# Patient Record
Sex: Female | Born: 1960 | Race: White | Hispanic: No | Marital: Married
Health system: Midwestern US, Academic
[De-identification: ages and names within clinical notes are randomized; demographics above are authoritative.]

---

## 2018-01-25 ENCOUNTER — Encounter: Admit: 2018-01-25 | Discharge: 2018-01-25 | Payer: Commercial Managed Care - PPO

## 2018-01-25 DIAGNOSIS — Z1231 Encounter for screening mammogram for malignant neoplasm of breast: Principal | ICD-10-CM

## 2021-07-22 ENCOUNTER — Encounter: Admit: 2021-07-22 | Discharge: 2021-07-22 | Payer: BC Managed Care – PPO

## 2021-07-22 ENCOUNTER — Ambulatory Visit: Admit: 2021-07-22 | Discharge: 2021-07-22 | Payer: BC Managed Care – PPO

## 2021-07-22 DIAGNOSIS — Z1231 Encounter for screening mammogram for malignant neoplasm of breast: Secondary | ICD-10-CM

## 2021-07-27 ENCOUNTER — Encounter: Admit: 2021-07-27 | Discharge: 2021-07-27 | Payer: BC Managed Care – PPO

## 2021-07-27 DIAGNOSIS — R928 Other abnormal and inconclusive findings on diagnostic imaging of breast: Secondary | ICD-10-CM

## 2021-08-08 ENCOUNTER — Encounter: Admit: 2021-08-08 | Discharge: 2021-08-08 | Payer: BC Managed Care – PPO

## 2021-08-08 ENCOUNTER — Ambulatory Visit: Admit: 2021-08-08 | Discharge: 2021-08-08 | Payer: BC Managed Care – PPO

## 2021-08-08 DIAGNOSIS — R928 Other abnormal and inconclusive findings on diagnostic imaging of breast: Secondary | ICD-10-CM

## 2021-08-08 NOTE — Progress Notes
I spoke with Kristy Martinez this afternoon in regards to the Radiologist recommending that she have a Stereotactic Breast Biopsy.   She was educated on what to expect during and after the procedure.   She does not take a daily aspirin or any anti-coagulants. She is scheduled for her biopsy procedure for 08/11/2021.  I reviewed with her that she needed to be at Breast Imaging 1 hour prior to the appointment time.  She verbalized understanding in regards to all instructions and did not have any questions.

## 2021-08-10 ENCOUNTER — Encounter: Admit: 2021-08-10 | Discharge: 2021-08-10 | Payer: BC Managed Care – PPO

## 2021-08-10 DIAGNOSIS — R928 Other abnormal and inconclusive findings on diagnostic imaging of breast: Secondary | ICD-10-CM

## 2021-08-10 DIAGNOSIS — R920 Mammographic microcalcification found on diagnostic imaging of breast: Secondary | ICD-10-CM

## 2021-08-11 ENCOUNTER — Ambulatory Visit: Admit: 2021-08-11 | Discharge: 2021-08-11 | Payer: BC Managed Care – PPO

## 2021-08-11 ENCOUNTER — Encounter: Admit: 2021-08-11 | Discharge: 2021-08-11 | Payer: BC Managed Care – PPO

## 2021-08-11 DIAGNOSIS — R928 Other abnormal and inconclusive findings on diagnostic imaging of breast: Secondary | ICD-10-CM

## 2021-08-11 DIAGNOSIS — R920 Mammographic microcalcification found on diagnostic imaging of breast: Secondary | ICD-10-CM

## 2021-08-11 MED ORDER — LIDOCAINE-EPINEPHRINE 1 %-1:100,000 IJ SOLN
30 mL | Freq: Once | INTRAMUSCULAR | 0 refills | Status: CP
Start: 2021-08-11 — End: ?
  Administered 2021-08-11: 14:00:00 10 mL via INTRAMUSCULAR

## 2021-08-11 MED ORDER — LIDOCAINE 1% (BUFFERED) SYRINGE (BREAST CENTER)
1 mL | Freq: Once | INTRAMUSCULAR | 0 refills | Status: CP
Start: 2021-08-11 — End: ?

## 2021-08-11 MED ORDER — LIDOCAINE 1%-EPINEPHRINE 1:100000 (BUFFERED) VIAL (BREAST CENTER)
7 mL | Freq: Once | INTRAMUSCULAR | 0 refills | Status: CP
Start: 2021-08-11 — End: ?

## 2021-08-11 NOTE — Progress Notes
Kristy Martinez is here for a right stereotactic breast biopsy today. She has been educated, risk of procedure were discussed and her questions answered prior to signing procedure consent with Dr. Laurance Flatten and this nurse present.

## 2021-08-16 ENCOUNTER — Encounter: Admit: 2021-08-16 | Discharge: 2021-08-16 | Payer: BC Managed Care – PPO

## 2021-08-16 NOTE — Telephone Encounter
LM for Kristy Martinez that scheduling had reached out to me and said they had scheduled an appointment for concerns on her recent mammogram. Looking into it, she has been diagnosed with a breast cancer and I am not an appropriate person for her to see with the new diagnosis. She needs to see one of the breast surgeons. I have reached out to navigation to contact her to schedule with a surgeon. I am cancelling the appointment for tomorrow. Callback provided.    Mina Marble, PA-C

## 2021-08-16 NOTE — Telephone Encounter
[facility] Intake Assessment    Patient Name:  Kristy Martinez  DOB: 07/07/1961  Insurance:  [employer]   Direct Referral: None  Appointment Info:    Future Appointments   Date Time Provider Department Center   08/19/2021  8:00 AM Ignatius Specking, MD [id number] [place] Exam   08/19/2021  9:00 AM Everitt Amber, MD [id number] [place] Exam   08/19/2021 10:00 AM [facility] RAD ONC PROVIDER Philip Aspen [place] Exam     Diagnosis & Reason for Visit:  Ductal carcinoma in situ      Physician Info:  ? Referring Physician:  Dr Steva Ready     Location of Films:   IN HOUSE    Location of Pathology:  IN HOUSE    History of Present Illness:  Kristy Martinez, age 60,  had right breast microcalcifications  discovered on her asymptomatic routine screening mammogram on 07/22/21 at [facility] .  She was diagnosed on follow up 08/11/21 biopsy with right breast ductal carcinoma in situ . Has history of 06/07/16 left  breast biopsy at [facility], demonstrated left breast fibroadenoma.      Final Diagnosis:   A. 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,   cribriform and solid type, with calcifications and comedonecrosis    Testing performed on block number: A1   Estrogen Receptor (ER):          70%     Positive     Stain Intensity:   Strong   Progesterone Receptor (PR):     10%     Positive     Stain Intensity:   Moderate     Allergies reviewed and verified with the patient, and documented in Epic:  Yes    Family History of Breast Cancer:   Breast Cancer: Maternal Grandma dx age 72s   Ovarian Cancer: None   Prostate Cancer: Maternal Grandpa dx age 71     Menopausal Status:  Post     Personal History of Other Cancers: None      NEEDS Assessment:    Genetic Counseling:  Genetic Assessment: No identified risk factors             Nutrition:  Additional Nutrition Assessment: No concerns identified  Nutrition Intervention: Provided information about available services    Social & Financial:  Social and Financial Assessment: Reports adequate support system     Social and Financial Intervention: Provided information about available services    Spiritual & Emotional:  Spiritual and Emotional Assessment: Reports adequate support system  Spiritual and Emotional Intervention: Provided information about available services    Physical:  Fall Risk: None identified       Communication:  Communication Barrier: No       Onc Fertility:   Onc Fertility Assessment: Female patient is postmenopausal or has had hysterectomy     COVID-19 guidelines reviewed with patient, including: visitor and universal masking policies, and a temperature check at the facility entrance upon arrival.

## 2021-08-17 ENCOUNTER — Encounter: Admit: 2021-08-17 | Discharge: 2021-08-17 | Payer: BC Managed Care – PPO

## 2021-08-18 NOTE — Patient Instructions
The anesthesia team will provide your arrival time for surgery approx. 1 week before date of surgery.   If you have questions or forget your arrival time, Call:  [phone]  **(Your surgery will not be listed under Visits  in your MyChart)**  BREAST SURGERY POST-OPERATIVE INSTRUCTIONS    DIET  -You have no dietary restrictions. Please continue with a healthy balanced diet.  -Do not drink alcohol while taking narcotic pain medication.    ACTIVITY  -No driving while taking narcotic pain medication.  -No lifting greater than 10 pounds.   -No repetitive motions with the arm on the side of surgery (ie. laundry, vacuuming, washing windows/floors, etc.)  -Begin the exercises found in your treatment guide 24 hours after surgery.  -You may resume your normal activity once cleared by your surgeon. This can be discussed at your post-operative appointment.    INCISION CARE  -Keep your incision clean and dry.    -You may shower 24 hours following your procedure. It is safe for the incisions to get wet unless otherwise instructed by your surgeon. Pat incisions dry after showering.  -Avoid applying deodorants, powders, creams, lotions, etc. to your incision for 4 weeks.  -Usually, there are no stitches to be removed. You will either have a Dermabond (skin glue) or Therabond (silver covering) dressing over your incision.    *Dermabond is an antimicrobial barrier that will begin to flake after approximately 1 week. Do not peel to remove prior to flaking. Do NOT apply ointments to your incisions over the Dermabond, as these substances can weaken the barrier and allow for skin edge separation.    *Therabond is a silver impregnated dressing that promotes wound healing. This dressing is to stay in place until your surgeon instructs you to remove it.  -Your incision should gradually look better each day.  If you notice unusual swelling, redness, drainage, have increasing pain at the site, or have a fever greater than 100 degrees, notify your physician immediately.  -You may have a pink post-surgery bra and fluffs in place.  This can be removed 24 hours after surgery to shower. Continue to wear the pink bra or a snug sports bra until your post-operative appointment. Do NOT wear underwire bras.    SIGNS & SYMPTOMS  -Please contact your doctor if you have any of the following symptoms: temperature higher than 100 degrees F, uncontrolled pain, persistent nausea and/or vomiting, difficulty breathing or breast redness, leakage or incision breakdown/opening.     IF YOU HAVE A DRAIN  *WASH HANDS PRIOR TO ANY HANDLING OF DRAIN.   -Please write down daily (total for 24 hours) drain output and empty multiple times a day if needed or according to physician's instructions. Record the output in milliliters.  -There is a CHG drain dressing in place over the entry site of your drain. This is to remain in place until removed by your surgeon. There is an antimicrobial gel in the center rectangle of the dressing. This is normal and not a sign of fluid leaking. If there is physical liquid leaking on the skin outside the area of the drain, please notify your surgeons office.   -To empty the drain, open the lid on top of the bulb.  Pour the fluid into a measuring cup and record.  Close by squeezing the bulb until as much air as possible is removed, then cap the lid to recreate suction.   -Strip the drain twice daily or as needed by pinching the tubing near  the skin with both hands.  Keep hand closest to you steady and with the other, slowly squeeze the liquid toward the bulb.  This will prevent any clots or debris from clogging the drain.   -It is okay to shower with the drain and dressing in place.  Pat the dressing dry after showering.    *When showering consider placing your back to the water and letting the water run over the surgical areas. Soap and water contact over the surgical sites is OK. Please do not soak or scrub the sites.     *A lanyard can be used to hook the drains around your neck to allow freedom of hands for showering.    -Please bring the drain record to your next clinic appointment.   -Secure drains to your post-operative bra or use a safety pin to pin to clothing. Do not let drains dangle without support.   -DO NOT TRY TO PUSH THE DRAIN BACK IN IF IT GETS PULLED OUT (even a little bit), AS THIS IS A BIG INFECTION RISK.  If you have any problems with the drain (ex. large amounts of fluid, very bloody fluid, or loss of suction), please call your doctor.    MEDICATIONS  -Do not exceed 4000mg  of Tylenol (acetaminophen) in a 24-hour period. The pain medication prescribed may have a narcotic and Tylenol together. Please verify this on your medication bottle prior to taking additional Tylenol.    OPIOID (NARCOTIC) PAIN MEDICATION SAFETY  -We care about your comfort and believe you may need opioid medications at this time to treat your pain.  An opioid is a strong pain medication.  It is only available by prescription for moderate to severe pain.  Usually, these medications are used for only a short time to treat pain.    -When used the right way, opioids are safe and effective medications to treat your pain.  Yet, when used in the wrong way, opioids can be dangerous for you or others.  Opioids do not work for everyone.  Most patients do not get full relief of their pain from opioid medication; full relief of your pain may not be possible.    -For your safety, we ask you to follow these instructions:  *Only take your opioid medication as prescribed.  If your pain is not controlled with the prescribed dose or the medication is not lasting long enough, call your doctor.  *Do not break or crush your opioid medication unless your doctor or pharmacist says you can.  With certain medications this can be dangerous and may cause death.  *Never share your medication with others even if they appear to have a good reason.  Never take someone else?s pain medication - this is dangerous and illegal.  Overdoses and deaths have occurred.  *Keep your opioid medications safe, as you would with cash, in a lock box or similar container.  *Make sure your opioids are going to be secure, especially if you are around children or teens.   *Talk with your doctor or pharmacist if you have questions about taking other medications.   *Avoid driving, operative machinery or drinking alcohol while taking opioid pain medication.  This may be unsafe.     CONSTIPATION PREVENTION  -Most patients require narcotic pain medication following surgery and these medications frequently cause significant constipation. Constipation can also occur post-operatively due to anesthesia, inactivity and decrease in oral intake. Constipation can cause discomfort, bloating, nausea and vomiting. Below are recommendations on how to  treat and prevent constipation if you experience this following surgery.    -Start a daily laxative regimen as soon as you start any narcotic pain medication or see any deviation from your normal bowel pattern. You will be given a prescription for Senna-S after surgery. If you stay overnight after your surgery, a laxative will be started while you are in the hospital.      -Medications that can help with constipation:    *Senna-S    *Miralax, 1-2 capfuls daily    *Fiber supplements (Fibercon or Benefiber). Follow manufacturer dosing recommendations.                                          You can also consider dietary fiber options such as prunes or prune juice.    *Probiotics (Bio-K+, Culturelle, Align or Florastor), 1-2 daily. Do not take a probiotic if you are on chemotherapy unless you have received permission from your oncologist.     *Milk of Magnesia 1,2000mg /9mL, 2-4 doses daily. Do not take if you have kidney issues or are on dialysis.     -If you are still having trouble with constipation despite using the above medications for 1-2 days without a bowel movement or have significant discomfort from constipation you may try the below over the counter option. Do not go longer than 2 days without a bowel movement before you try taking additional laxatives.    *Magnesium Citrate (1-2 bottles in 24 hour period). Do not take if you have kidney issues or are on dialysis.       For questions or concerns regarding your hospital stay:  - DURING BUSINESS HOURS (8:00 AM - 4:30 PM):    Call [phone] to speak to your surgeon?s nurse    - AFTER BUSINESS HOURS (4:30 PM - 8:00 AM, on weekends, or holidays):  Call [phone] and ask to page the BREAST SURGERY team      IF A PLASTIC SURGEON WAS INVOLVED IN YOUR SURGERY:    - DURING BUSINESS HOURS (8:00 AM - 4:30 PM):    Call the office at [phone]    - AFTER BUSINESS HOURS (4:30 PM - 8:00 AM, on weekends, or holidays):  Call [phone] and ask to page the PLASTIC SURGERY team    *If you had reconstruction with a plastic surgeon, all pain medication refills will be completed by their office.       Post-Operative Exercises after Breast Surgery      Once your breast cancer surgery is complete, you should immediately begin performing simple exercises.  These exercises are safe to do even if you have drains in place and are designed to promote circulations, keep your muscles strong, reduce stiffness in your shoulder and reduce scar tissue.  Your chest and underarm may feel tight after your surgery, but this is normal and should decrease once you begin the exercises.  Sore or numb feelings in the chest wall or back of the arm are also normal, but call if you experience any swelling or notice any redness or tenderness.    Note:  If you underwent reconstructive surgery, talk to your surgery team about when to begin these exercises.        Exercise 1.    In this exercise, you will bend your arm at the elbow.  Then, you will extend your arm back straight.    Lorenz Coaster  down and extend your arm (on the surgery side) straight to your side.  Keep your palm facing forward.  Bend your arm at the elbow.  Extend your arm back straight, fully stretching it out.  Repeat five times, three sets per day.       Exercise 2.  During this exercise, you will rotate your shoulders slowly in a backward motion, then in a forward motion.    Lie down and relax your shoulders.  Rotate your shoulders slowly in a backward motion.  Reverse the motion and slowly rotate them forward.  Repeat five times, three sets per day.          Exercise 3.  This exercise has you squeeze your shoulder blades together.    Lie down face up and keep your arms at your sides.  Squeeze your shoulder blades together.  Hold this position for five seconds.  Repeat five times, three sets per day.         Exercise 4.  In this exercise, you will extend your forearms out, forming right angles at your elbows.    Lie down face up on the bed, and keep your arms to your sides.  Extend your forearms out, forming right angles at your elbows.  Turn your forearms out so the backs of your hands face the bed.  Bring your forearms back in and bring your palms towards your abdomen.  Repeat five times, three sets per day.         Exercise 5.  This exercise has you raising your forearms to form a right angle at your elbow, then pushing your elbow down against the bed.    Lie down face up on the bed and hold your arm next to your side.  Raise your forearm to form a right angle at your elbow.   Push your elbow down against the bed.  Repeat five times, three sets per day.        Exercise 6.  In this exercise, you will push your arm against the wall and then pull it tight back to your side.    Stand with your side against a wall.  Keep your upper arm next to your side and bring your forearm up to form a right angle at your elbow.  Push your arm against the wall and then pull it tight back to your side.  Repeat five times, three sets per day.                       POST-OPERATIVE EXERCISES WITHOUT DRAINS    If you don?t have drains - or if your drains have been removed -  you can now begin this set of exercises. These are designed to improve your range of motion and strengthen your arm and shoulder.  Be sure to complete the exercises carefully to achieve a slow stretch, and avoid bouncing or jerking your arm.  Full range of motion should return within one to two months following your surgery, so if you continue to have trouble completing your normal activities (such as dressing, grooming, etc.) talk to your doctor, who may refer you to a physical therapist.      Exercise 1.  In this exercise, you will rotate your shoulders slowly in a backward motion,  then in a forward motion.    Lie down face up and relax your shoulders.  Rotate your shoulders slowly in a backward motion.  Reverse the motion and  slowly rotate them forward.  Repeat five times, three sets per day.      Exercise 2. This exercise has you squeeze your shoulder blades together.    Lie down face up and keep your arms at your sides.  Squeeze your shoulder blades together.  Hold this position for five seconds.  Repeat five times, three sets per day.       Exercise 3.  During this exercise, you will slowly pull your elbow and arm across your chest.    Lie down face up and grab the elbow that?s on the side of your surgery with your other hand.  Slowly pull your elbow and arm across your chest.  You should feel a stretch.  Hold this position for five seconds.  Repeat five times, three sets per day      Exercise 4.  Here, you will raise your arm (on your surgery side) over your head as far as you can reach.    Lie down face up and raise your arm (on your surgery side) over your head as far as you can reach.  Keep your thumb down and your elbow straight.  Hold this position for five seconds and then slowly return your arm to the original position.  Repeat five times, three sets per day      Exercise 5.  This exercise stretches across your chest as you slowly lower your elbows down to the sides.    Lie down face up and place your hands behind your neck.  Point your elbows up.  Slowly lower your elbows down to touch the bed.  Repeat five times, three sets per day       Exercise 6.  You will use a small, rolled towel during this exercise.    Lie down on your back on a small, rolled towel (about 2-3 inches thick) placed between your shoulder blades.  Remain in this position on the towel for 5 minutes.  Repeat three times each day       Exercise 7.  You should feel a stretch in your shoulder as you gently pull your arm (on the surgery side) behind you.    Stand and reach behind you with the arm oth the surgery side.  Grasp that arm with your other hand.  Gently pull your arm up.  You should feel a stretch in your shoulder.  Hold this position for five seconds.  Repeat five times, three sets per day       Exercise 8.  This stretch requires the use of a wall to stretch your shoulders.    Stand in a corner 1 to 2 feet away from the wall.  Place your hands on the wall.  Lean in to gently stretch your chest and your shoulders on the surgery side.  Vary the exercise by placing your hands higher or lower on the wall, or by stepping back from the corner slightly.  Hold the stretch for 30 seconds.  Repeat five times, three sets per day        EXAMPLES OF POSTOPERATIVE BRAS    -You will go home from surgery with a pink bra in place.     -This is only a guideline with examples of other appropriate postoperative bras.  You do not need to purchase these unless you would like additional bras to wear.  [facility] does not profit from any of these bras  - Wear non-under wire bras for 4 weeks.   -  Sports bras that snap or zip in front are usually the easiest after breast surgery.     [employer]:  women?s power shape max support front closure   Price: $24.99 + tax       -    [employer]: Fruit [employer]oom Comfort front close       Price: $7-$8 + tax       [employer]:  [employer] Bra [id number]  Price [phone] + tax          Under Armour- ARMOUR eclipse high impact zip bra   Higher quality.  Will last forever if you hang up to dry.  Has best support for exercise.  Online or in stores: Under Armour zip front sports bra  Can find at [employer], sports stores, some [place], or online.    Price varies: $59.99 and up + tax          -     [employer]: [employer]'s Easy Does It No Dig Wire-Free Bra - [id number]        Price: $19.99 - $38 + Tax

## 2021-08-18 NOTE — Progress Notes
Spoke with patient regarding R RSL Lump and confirmed surgery date of 08/29/21 with Dr. Junious Silk. Consent was obtained at this time. The patient was informed that they would receive a call from the surgery department the day prior to surgery to confirm time of arrival.  The patient was given detailed instructions about where to check in the day of surgery.     Paperwork explaining pre-operative instructions was given to the patient and reviewed in detail. Informed patient that we will see them post operatively 1-2 weeks after date of surgery. Discussed whether this appointment would be appropriate as a telehealth appointment.     The patient has been educated of NPO requirements starting at midnight the night before surgery. Also informed that a driver will need to accompany patient due to the influence of anesthesia including narcotics post procedure. Informed patient that the Anesthesia department will call to discuss lab work, medication review (will discuss medications that need to be stopped 7-10 days prior to surgery), health history, anesthesia/ surgical history, and any other additional recommended testing for clearance for surgery.      The patient verbalized understanding of the information given and was provided a phone number to call with any questions or concerns.

## 2021-08-19 ENCOUNTER — Encounter: Admit: 2021-08-19 | Discharge: 2021-08-19 | Payer: BC Managed Care – PPO

## 2021-08-19 ENCOUNTER — Ambulatory Visit: Admit: 2021-08-19 | Discharge: 2021-08-19 | Payer: BC Managed Care – PPO

## 2021-08-19 DIAGNOSIS — D0511 Intraductal carcinoma in situ of right breast: Secondary | ICD-10-CM

## 2021-08-19 DIAGNOSIS — J45909 Unspecified asthma, uncomplicated: Secondary | ICD-10-CM

## 2021-08-19 DIAGNOSIS — Z01818 Encounter for other preprocedural examination: Secondary | ICD-10-CM

## 2021-08-19 DIAGNOSIS — Z9189 Other specified personal risk factors, not elsewhere classified: Secondary | ICD-10-CM

## 2021-08-19 DIAGNOSIS — K219 Gastro-esophageal reflux disease without esophagitis: Secondary | ICD-10-CM

## 2021-08-19 LAB — CBC AND DIFF
ABSOLUTE BASO COUNT: 0 K/UL (ref 0–0.20)
ABSOLUTE EOS COUNT: 0.2 K/UL (ref 0–0.45)
ABSOLUTE LYMPH COUNT: 1.3 K/UL (ref 1.0–4.8)
ABSOLUTE MONO COUNT: 0.5 K/UL (ref 0–0.80)
ABSOLUTE NEUTROPHIL: 4.2 K/UL (ref 1.8–7.0)
BASOPHILS %: 1 % (ref 0–2)
EOSINOPHILS %: 4 % (ref >60)
HEMATOCRIT: 41 % (ref 36–45)
HEMOGLOBIN: 13 g/dL (ref 12.0–15.0)
LYMPHOCYTES %: 21 % — ABNORMAL LOW (ref 24–44)
MCH: 30 pg — ABNORMAL HIGH (ref 26–34)
MCHC: 33 g/dL (ref 32.0–36.0)
MCV: 90 FL — ABNORMAL LOW (ref 80–100)
MONOCYTES %: 8 % (ref 4–12)
MPV: 7.9 FL (ref 7–11)
NEUTROPHILS %: 66 % (ref 41–77)
PLATELET COUNT: 349 K/UL (ref 150–400)
RBC COUNT: 4.6 M/UL — ABNORMAL HIGH (ref 4.0–5.0)
RDW: 14 % (ref >60)
WBC COUNT: 6.4 K/UL (ref 4.5–11.0)

## 2021-08-19 LAB — COMPREHENSIVE METABOLIC PANEL
POTASSIUM: 4.6 MMOL/L (ref 3.5–5.1)
SODIUM: 140 MMOL/L (ref 137–147)

## 2021-08-19 MED ORDER — CEFAZOLIN INJ 1GM IVP
2 g | Freq: Once | INTRAVENOUS | 0 refills
Start: 2021-08-19 — End: ?

## 2021-08-19 NOTE — Progress Notes
Bioimpedance Spectroscopy performed.  Advised patient that additional information will be sent via Mychart (preferred) or phone if indicated by the lymphedema nurse within 24 hours.

## 2021-08-19 NOTE — Progress Notes
[facility] - Radiation Oncology Consultation    Date: 08/19/2021    Kristy Martinez is a 60 y.o. female.     Requesting Provider: Hoyt Koch, MD   PCP: Steva Ready, MD  Surgeon: Janeth Rase, MD    The encounter diagnosis was Ductal carcinoma in situ (DCIS) of right breast.  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 ER 70%, PR 10%. Diagnosed 07/2021    History of Present Illness:   Kristy Martinez is a 60 y.o. female  I personally reviewed her previous medical records including imaging, pathology, and other provider records, to obtain some of this information. Her oncologic history is briefly as follows:    -The patient is a lovely 59 year old female who reports that she did not have any adverse findings or breast concerns prior to presenting for screening mammogram.  - 07/22/2021 ([place]) bilateral screening mammogram: No suspicious abnormality in the left breast.  In the upper outer right breast, middle posterior depth there is a focal asymmetry with associated microcalcifications.  - 08/08/2021 ([place]) right diagnostic mammogram: Persistent 1.5 cm focal asymmetry with subtle architectural distortion in the upper outer right breast middle posterior depth at approximately 10:00, with associated fine pleomorphic calcifications.  The calcifications in aggregate measure approximately 2.0 x 1.5 x 2.3 cm.  - 08/08/2021 ([place]) targeted right breast ultrasound: No convincing ultrasound correlate identified for mammographic asymmetry of concern.  5 morphologically normal-appearing right axillary lymph nodes.  Suspicious focal asymmetry with associated calcifications upper outer right breast 10:00, biopsy recommended.  - 08/11/2021 ([place]) biopsy right breast upper outer quadrant 10:00: ER positive, PR positive, intermediate to high nuclear grade ductal carcinoma in situ.    The patient presents to the  [facility] breast multidisciplinary clinic for recommendations regarding her recently diagnosed right breast DCIS.  She has been seen and evaluated by Dr. Janeth Rase as well as Dr. Everitt Amber.  She has been offered breast conserving surgery.  She is being seen to discuss the role for adjuvant radiation therapy in the setting of breast conserving treatment.    She is accompanied today by her husband.  She has no new complaints or concerns to report.           Past Medical History:  Patient Active Problem List    Diagnosis Date Noted   ? Ductal carcinoma in situ (DCIS) of right breast 08/17/2021   ? Abnormal mammogram of left breast 06/06/2016     Medical History:   Diagnosis Date   ? Asthma      Surgical History:   Procedure Laterality Date   ? TONSILLECTOMY      at age 63    ? TUBAL LIGATION      at age 60         Prior Radiation History: None    Medications  ? citalopram (CELEXA) 20 mg tablet Take 20 mg by mouth daily.   ? cyanocobalamin/folic ac/vit B6 (WESTAB MAX PO) Take  by mouth daily.   ? FLOVENT HFA 110 mcg/actuation inhaler Inhale 2 puffs by mouth into the lungs daily.   ? fluticasone propionate (  FLONASE) 50 mcg/actuation nasal spray, suspension Apply 2 sprays to each nostril as directed daily. Shake bottle gently before using.   ? LORazepam (ATIVAN) 0.5 mg tablet Take 0.5 mg by mouth at bedtime as needed for Nausea.   ? montelukast (SINGULAIR) 10 mg tablet Take 10 mg by mouth every morning.   ? omeprazole DR (PRILOSEC) 40 mg capsule Take 40 mg by mouth daily before breakfast.       Allergies:   No Known Allergies    Social History:  Social History     Tobacco Use   ? Smoking status: Never Smoker   ? Smokeless tobacco: Never Used   Substance and Sexual Activity   ? Alcohol use: Not Currently   ? Drug use: Not Currently        Family History:  Family History   Problem Relation Age of Onset   ? Arthritis-osteo Mother    ? Thyroid Disease Father    ? Cancer-Breast Paternal Aunt 32   ? Cancer-Breast Maternal Grandmother         36s   ? Cancer-Prostate Maternal Grandfather    ? Diabetes Paternal Grandmother        Review of Systems   Constitutional: Negative.    HENT: Negative.    Eyes: Negative.    Respiratory: Negative.  Negative for shortness of breath.    Cardiovascular: Negative.  Negative for chest pain.   Gastrointestinal: Negative.  Negative for nausea and vomiting.   Endocrine: Negative.    Genitourinary: Negative.    Musculoskeletal: Negative.    Skin: Negative.    Allergic/Immunologic: Negative.    Neurological: Negative.  Negative for headaches.   Hematological: Negative.    Psychiatric/Behavioral: Negative.    All other systems reviewed and are negative.       Patient Evaluated for a Clinical Trial: No treatment clinical trial available for this patient.    KARNOFSKY PERFORMANCE SCORE:  100% Normal, no complaints   Physical Exam  Vitals and nursing note reviewed.   Constitutional:       General: She is not in acute distress.     Appearance: Normal appearance. She is normal weight. She is not ill-appearing.   HENT:      Head: Normocephalic and atraumatic.   Pulmonary:      Effort: Pulmonary effort is normal. No respiratory distress.   Chest:   Breasts:      Right: Tenderness present.      Left: Normal.        Comments: Bilateral breasts are large and pendulous.  Bruising at the biopsy site noted in the lateral right breast upper outer quadrant.  No signs of infection.  Abdominal:      General: Abdomen is flat.   Musculoskeletal:      Cervical back: Normal range of motion.   Lymphadenopathy:      Cervical: No cervical adenopathy.      Upper Body:      Right upper body: No supraclavicular, axillary or pectoral adenopathy.      Left upper body: No supraclavicular, axillary or pectoral adenopathy.   Skin:     General: Skin is warm and dry.   Neurological:      General: No focal deficit present.      Mental Status: She is alert and oriented to person, place, and time. Mental status is at baseline.      Cranial Nerves: No cranial nerve deficit.   Psychiatric:  Mood and Affect: Mood normal.         Thought Content: Thought content normal.         Judgment: Judgment normal.              LABORATORY:     Comprehensive Metabolic Profile    No results found for: NA, K, CL, CO2, GAP, BUN, CR, GLU No results found for: CA, PO4, ALBUMIN, TOTPROT, ALKPHOS, AST, ALT, TOTBILI, GFR, GFRAA     CBC w diff    No results found for: WBC, RBC, HGB, HCT, MCV, MCH, MCHC, RDW, PLTCT, MPV No results found for: NEUT, ANC, LYMA, ALC, MONA, AMC, EOSA, AEC, BASA, ABC       IMAGING:  [id number] MAMMO DIAGNOSTIC RT/TOMO: RIGHT BREAST - August 08, 2021 - ACCESSION #: [id number]   2D/3D Procedure   3D Routine views.   2D Routine views.       Radiologists: Konrad Saha, M.D.; Wilmer Floor, Radiology   Breast Fellow   Prior study comparison: July 22, 2021, bilateral [id number]   DIGITAL MAMMO SCREEN BILAT/TOMO/CAD performed at [facility]   of [facility]. ?January 25, 2018, bilateral [id number]   DIGITAL MAMMO SCREEN BILAT/TOMO/CAD performed at The [facility]   of [facility].     History: 60 year old female presents for evaluation of right   breast focal asymmetry with associated calcifications seen on   screening mammogram dated 07/22/2021.     2-D and 3-D images of the right breast were obtained, including   spot compression and magnification views.     There is a persistent 1.5 cm focal asymmetry with subtle   architectural distortion within the upper outer right breast   middle-posterior depth at approximately 10:00. The focal   asymmetry has associated fine pleomorphic calcifications in   aggregate measuring approximately 2.0 x 1.5 x 2.3 cm in the AP,   TV and CC dimensions. Further evaluation with targeted right   breast ultrasound will be performed.     [id number] US BREAST TARGET RT: RIGHT BREAST - August 08, 2021 -   ACCESSION #: [id number]   Radiologist: Konrad Saha, M.D.   Technologist: Deloria Lair, Sonographer   Targeted right breast and right axillary ultrasound was   performed.     No convincing ultrasound correlate is identified for the   mammographic focal asymmetry of concern.     5 morphologically normal appearing right axillary lymph nodes are   documented.     Impression:     Suspicious focal asymmetry with associated calcifications in the   upper outer right breast at 10:00. Stereotactic/tomosynthesis   guided biopsy is recommended.     Findings and recommendations were discussed with the patient by   Dr. Maisie Fus at the end of the exam.       Approved by Wilmer Floor on 08/08/2021 3:24 PM       [id number] DIGITAL MAMMO SCREEN BILAT/TOMO/CAD: July 22, 2021 -   ACCESSION #: [id number]   3D Procedure   3D Routine views.   2D Synthetic Routine views.     Prior study comparison: January 25, 2018, bilateral [id number] DIGITAL   MAMMO SCREEN BILAT/TOMO/CAD performed at [facility]. ?June 07, 2016, left breast [id number] MAMMO DIAG LT   performed at [facility]. ?May 25, 2016, left breast diagnostic mammogram.     There are scattered areas of fibroglandular density. ?3-D   (digital tomosynthesis)  and synthetic 2-D images were obtained   bilaterally.     No suspicious abnormality is seen in the left breast.     In the upper outer right breast, middle/posterior depth, there is   a focal asymmetry with associated microcalcifications (MLO slice   30 and CC slice 38). Recommend right diagnostic mammogram and   targeted ultrasound for further evaluation.     By my electronic signature, I attest that I have personally   reviewed the images for this examination and formulated the   interpretations and opinions expressed in this report Finalized by Mosetta Anis, M.D. on 07/22/2021 9:49 AM.   Dictated by Geraldo Docker, M.D. on 07/22/2021 9:19 AM.       Electronically signed and approved by: Mosetta Anis, M.D.   [phone]   IMPRESSION     ASSESSMENT: BIRAD 0-Incomplete: need additional imaging   evaluation     PATHOLOGY:  PATHOLOGY REPORT   Date Value Ref Range Status   08/11/2021   Final    [facility]  [email or web address]    Department of Pathology and Laboratory Medicine  [address], [place], [place] [address]  Surgical Pathology Office:  [phone]  Fax:  [phone]  SURGICAL PATHOLOGY REPORT    NAME: JOEANN, HAAR PATH #: [id number] MR #: [id number] SPECIMEN  CLASS: SR BILLING #: [id number] ALT ID #:  LOCATION: [id number] DATE OF  PROCEDURE: 08/11/2021 AGE:  60 SEX: F DATE RECEIVED: 08/11/2021 DOB:  1961-10-25  TIME RECEIVED:  11:23 PHYSICIAN: JOHN R EPLEE DATE OF REPORT:  08/15/2021 COPY TO: Gwynneth Aliment, MD DATE OF PRINTING: 08/15/2021   Procedures/Addenda  Addendum     Date Ordered:     08/15/2021     Status:  Signed Out  Date Complete:     08/15/2021  By: Margurite Auerbach, MD  Date Reported:     08/15/2021             Addendum Comment  Breast Biomarker Panel by Immunohistochemistry (IHC) using Quantitative  Computer-Assisted Image Analysis    Testing performed on block number: A1  Estrogen Receptor (ER):          70%     Positive     Stain Intensity:  Strong  Progesterone Receptor (PR):     10%     Positive     Stain Intensity:  Moderate    The original image analysis report is scanned in Epic under Results  Review.  Disclaimer: The [facility] is certified  under the Clinical Laboratory Improvement Amendments of 1988 (CLIA) to  perform high complexity clinical laboratory testing. The Food and Drug  Administration (FDA) cleared estrogen receptor/progesterone receptor  (ER/PR) assays and the FDA approved Her2 assay are performed on  formalin-fixed, paraffin-embedded tissue sections using the Ventana  Benchmark Ultraview Universal DAB detection kit with Ventana Confirm ER  antibody (clone SP1), Ventana Confirm PR antibody (clone [id number]), and Ventana  Pathway Her2/neu antibody (clone [id number]). Ki-67 is performed using Dako Flex  Ki-67 antibody (clone MIB-1) with Dako Flex Dab detection kit on  autostainer Link 48. Interpretations of the ER/PR and Her2 results follow  the most current American Society of Clinical Oncology/College of American  Pathologists (ASCO/CAP) guidelines. Estrogen receptor and progesterone  receptor studies are reported as a percentage of positive tumor nuclei  that express immunoreactivity and intensity of staining. For breast  cancer, estrogen receptor and progesterone receptor positivity 1% or  greater  is considered positive. HER-2/neu that demonstrates incomplete  membrane staining that is faint/barely perceptible and within d10% of  tumor cells is scored as 0 and within >10% of tumor cells is scored as 1+.  A score of 0 or 1+ is considered negative. Weak to moderate complete  membrane staining in >10% of tumor cells or complete membrane staining  that is intense but within d10% of tumor cells is scored as 2+ equivocal  and will be reflexed to FISH. A score of 3+ requires strong and complete  membrane staining in greater than 10% of tumor cells and is considered  positive. Known positive and negative control tissues show appropriate  staining. Cold ischemic time and formalin fixation time of the tissue meet  the ASCO/CAP requirements unless noted in the pathology report. This assay  has not been validated on decalcified tissues. The image analysis is  performed using Ventana Virtuoso Image Analysis system (v5.6.2). ER  Negative, Low Positive, and Weak staining results are adjudicated  according to the steps in the Standard Operating Procedure of the [facility]. ########################################################################  Final Diagnosis:    A. Breast, Right breast a with calcs 10:00, stereotactic core biopsy:  Ductal carcinoma in situ (DCIS), intermediate to high nuclear grade,  cribriform and solid type, with calcifications and comedonecrosis.      Comment:  Immunohistochemical stains (performed on A1) for p63 and E-Cadherin  highlights intact myoepithelial cells.     DCIS OF THE BREAST: Biopsy  CAP Version: Breast DCIS 1.0.1.0    Procedure: Needle biopsy  Specimen Laterality: Right  Tumor Site: 10:00  Histologic type: Ductal carcinoma in situ (DCIS)  Architectural Patterns:   Nuclear Grade: Grade II (intermediate) to grade III (high)  Necrosis: Present, central (expansive comedo necrosis)  Microcalcifications: Present in DCIS  Extent: DCIS is in multiple cores, more than 5 mm in aggregate.   Biomarker Testing: Ordered. The results will be issued as an addendum.  Time between tumor removal and placement into formalin < 1 hour: Ye  Fixation time between 6-72 hours: Yes    Block for Biomarker Testing: A1    Pursuant to the Quality Assurance Program at the [facility] Pathology Department, selected slides from this case have been  concurrently reviewed by the following pathologist: Dr. Murvin Natal who  agrees with the final diagnosis.          Attestation:  By this signature, I attest that I have personally formulated the final  interpretation expressed in this report and that the above diagnosis is  based upon my examination of the slides and/or other material indicated in  this report.    +++Electronically Signed Out By Margurite Auerbach, MD on 08/15/2021+++             bm/08/11/2021             ########################################################################  Material Received:  A: right breast a with calcs 10:00    History:  60 year old female with a history of abnormal mammogram,  microcalcification of breast.        Gross Description:  A. Received in formalin labeled right breast A with calcs 10:00  o'clock is a 2.9 x 2.6 x 0.3 cm aggregate of cylindrical yellow-tan,  bloodstained, fatty tissue fragments. The specimen is entirely submitted  in cassette A1. The breast biopsy is removed from the patient at 855 on  08/11/2021, placed in formalin at 908 on 08/11/2021, and not removed from  formalin until 2340 on  08/11/2021. (mpt)    mf/08/11/2021           If immunohistochemical stains and/or in situ hybridization are cited in  this report, the performance characteristics were determined by the  Department of Pathology and Laboratory Medicine of the [facility] of  [facility] Pathology Association) in compliance with CLIA'88  regulations. Some of these tests rely on the use of analyte specific  reagents and are subject to specific labeling requirements by the FDA.  The stains are performed on formalin-fixed, paraffin-embedded tissue,  unless otherwise stated. Known positive and negative control tissues  demonstrate appropriate staining. Results should be interpreted with  caution given the likelihood of false negativity on decalcified specimens.  This testing was developed by the Department of Pathology and Laboratory  Medicine of the [place] [facility] [place]. It has not been cleared or approved  by the FDA.  The FDA has determined that such clearance or approval is not  necessary.     ]        ASSESSMENT: 60 y.o. 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 ER 70%, PR 10%. Diagnosed 07/2021.  Marland Kitchen    RECOMMENDATIONS:    I had a long discussion with Ms. Orris regarding the natural history of ductal carcinoma in situ, her clinical and pathologic findings.  I reviewed her treatment options as well as pros and cons of radiation therapy.   I have discussed her case with Dr. Janeth Rase, and Dr.Manana Arliss Journey in multidisciplinary fashion.  The patient is a candidate for breast conserving treatment.    Adjuvant radiation therapy is a critical component of breast conservation therapy after lumpectomy.  The most recent update of the EBCTCG metaanalysis for early stage breast cancer showed that for patients with pathologically node negative disease, radiotherapy reduces the 5 year risk of any recurrence from 31% to 15% in patients treated with lumpectomy (Lancet, 2011).     Radiation has been shown to reduce rates of recurrence by at least 50% on four large international trials for DCIS, there is no improvement in survival when radiation is added to treatment, as survival is excellent regardless of treatment approach (EBCTCG, JNCI, 2010). The most recent randomized study of observation versus radiation after lumpectomy for DCIS was RTOG 9804 Kathlene November et al., JCO, 2015). This study selected out patients with low risk DCIS, grade 1-2, detected on mammogram, with wide surgical margins at time of lumpectomy (3mm). They found that in the modern era with improved surgical techniques, imaging techniques, pathologic review and radiation, risk of recurrence was very low with surgery alone, at a rate of 1% per year.  Radiation significantly reduced the rate of recurrence from 6.4% to 0.9% at 7 years. Therefore, addition of radiation reduced local recurrence rates to those of mastectomy.  It should be noted, however, that recurrence rates likely continue to increase linearly over time without a stopping point, thus in a young patient, lifetime risk of recurrence may be as high as 50% without radiation.      Different dose and fractionation schedules have been used for early breast cancer. Recent long term studies from [place] and the [place] show equivalent rates of tumor control with similar or possibly reduced cosmetic changes in the setting of hypofractionation versus conventional fractionation Particia Nearing, Lancet Oncol, 2013; Luiz Blare, 2010). Hypofractionation was allowed on the RTOG 9804 trial, and there are also several large randomized invasive breast cancer trials that  have shown equivalence of hypofractionation to conventional radiation for whole breast radiation Barnetta Chapel, NEJM 2010; Elita Quick 2013). In light of these results, patients requiring whole breast radiation after BCS a hypofractionated approach over 3-4 weeks has become part of the standard of care recommendations. This would generally consist of radiation to the whole breast over 15-16 fractions, +/- a boost to the lumpectomy bed of up to 5 fractions.    The [place] [facility] [place] has multiple satellite cancer centers where radiation oncology services are available.  [place]. The [facility] at [address] will likely be the most convenient location for her to receive adjuvant services.  We will be happy to see her at completion of surgery to review her final pathology and make formal recommendations for adjuvant radiation therapy.       We also discussed the logistics of treatment planning and delivery, as well as the potential risks, benefits, acute and late side effects that may be experienced with radiation treatment. These risks to include, but not limited to, skin reactions, redness, tenderness, peeling, and fatigue.  Late effects may include issues with breast cosmesis, and less commonly radiation pneumonitis causing cough and shortness of breath, brachial plexopathy, rib fractures, cardiac disease and secondary cancers.     She asked very good questions and expressed understanding the rationale behind my recommendation and wished to proceed.    The plan at this time is for the patient to proceed with surgery as per Dr. Janeth Rase.  Formal recommendations regarding adjuvant systemic and radiation therapy interventions to be made pending final pathology review.    I am grateful for the opportunity to participate in the care of Mrs. Al.    Please do not hesitate contact me if there are questions regarding today's visit.    Elby Showers, MD                 Total Time Today was 60 minutes in the following activities: Preparing to see the patient, Obtaining and/or reviewing separately obtained history, Performing a medically appropriate examination and/or evaluation, Counseling and educating the patient/family/caregiver, Ordering medications, tests, or procedures, Referring and communication with other health care professionals (when not separately reported), Documenting clinical information in the electronic or other health record, Independently interpreting results (not separately reported) and communicating results to the patient/family/caregiver and Care coordination (not separately reported)    Parts of this note were created using voice recognition software.  Please excuse any grammatical or typographical errors.               CC: A copy of this note has been sent to the referring provider, Dr. Hoyt Koch, MD.

## 2021-08-19 NOTE — Progress Notes
Lymphedema Prevention Bioimpedance Spectroscopy (BIS) Monitoring    BIS obtained for baseline measurements prior to surgery.    Baseline BIS = 6.6, Right  No notification indicated     Dominant hand: right handed    A BIS test (Bioimpedance Spectroscopy test) was done in coordination with your breast surgery appointment today for baseline lymphedema evaluation only. You are not at risk for developing lymphedema until after surgery. The BIS test is a scale used to help detect early signs of lymphedema. Your baseline measurement from today will be used to aid with lymphedema monitoring in the future. Our clinic will meet with you for lymphedema education post-operatively and will explain this test in greater detail at that time.

## 2021-08-19 NOTE — Pre-Anesthesia Patient Instructions
GENERAL INFORMATION    Before you come to the hospital  If you are having an outpatient procedure, you will need to arrange for a responsible ride/person to accompany you home due to sedation or anesthesia with your procedure. A responsible person is a person who has the ability to identify a change in the patient's status and notify medical personnel.  This is typically a family member or friend.  Public transportation is permitted if you have a responsible person to accompany you.  An [employer] Goad, taxi or other public transportation driver is not considered a responsible person to accompany you home.  Bath/Shower Instructions  Take a bath or shower with antibacterial soap the night before or the morning of your procedure. Use clean towels.  Put on clean clothes after bath or shower.  Avoid using lotion and oils.  If you are having surgery above the waist, wear a shirt that fastens up the front.  Sleep on clean sheets if bath or shower is done the night before procedure.  Leave money, credit cards, jewelry, and any other valuables at home. The [facility] is not responsible for the loss or breakage of personal items.  Remove nail polish, makeup and all jewelry (including piercings) before coming to the hospital.  The morning of your procedure:  brush your teeth and tongue  do not smoke, vape, chew or use any tobacco products  do not shave the area where you will have surgery    What to bring to the hospital  ID/ Insurance Card  Medical Device card  Official documents for legal guardianship   Copy of your Living Will, Advanced Directives, and/or Durable Power of Attorney.  If you have these documents, please bring them to the admissions office on the day of your surgery to be scanned into your records.  Small bag with a few personal belongings  CPAP/BiPAP machine (including all supplies)  Walker, cane, or motorized scooter  Cases for glasses/hearing aids/contact lens (bring solutions for contacts)  Dress in clean, loose, comfortable clothing     Eating or drinking before surgery  Do not eat or drink anything after 11:00 p.m. the day before your procedure (including gum, mints, candy, or chewing tobacco) OR follow the specific instructions you were given by your Surgeon.  You may have WATER ONLY up to 2 hours before arriving at the hospital.     Other instructions  Notify your surgeon if:  there is a possibility that you are pregnant  you become ill with a cough, fever, sore throat, nausea, vomiting or flu-like symptoms  you have any open wounds/sores that are red, painful, draining, or are new since you last saw the doctor  you need to cancel your procedure    Notify us at [place]: [phone]  if you need to cancel your procedure  if you are going to be late    Arrival at the hospital  [facility]  [address]  [place], [place] [address]    This is at the [address] of [address]  Use the main entrance of the hospital, at the [facility] corner of the building. Parking is free.  Check-in for surgery is inside the main entrance.  If you are a woman between the ages of 76 and 36, and have not had a hysterectomy, you will be asked for a urine sample prior to surgery.  Please do not urinate before arriving in the Surgery Waiting Room.  Once there, check in and let the attendant know if you need to provide a sample.    Your surgery is scheduled on *** at ***. Your arrival time is ***          For the safety of all patients, visitors and staff as we work to contain COVID-19, we must restrict patient visitors.    Current Visitor Policy (06/20/21):    Our current, and ongoing, visitor rules in surgery and procedural areas are:    2 visitors per patient will be allowed to accompany the patient and wait in the Waiting Room.  1 visitor will be allowed into the pre-op and post-op areas - Timing of this is at the discretion of the RN and will likely be upon completion of all pre-op activities and post-op assessments.     Patients in inpatient and pediatric units, Emergency Department, ambulatory clinics and lab appointments may only have two visitors.       For inpatient stays, patients may have 2 visitors at a time at their bedside. The two visitors can change throughout the day, but no more than two at a time may be bedside.  The policy applies to [facility] [place], [address], [place] and [place] campuses and clinics.    Exceptions include:  No visitors allowed for patients with active COVID-19 infections.  Children younger than age 40 are allowed to visit inpatients.  Two parents/guardians are allowed for surgical or procedural patients younger than 62 years old.  Adult inpatients in semiprivate rooms may have visitors, but visits should be coordinated so only two total visitors are in a room at a time due to space limitations.    Visitors must be free of fever and symptoms to be in our facilities. We ask visitors to follow these guidelines:  Wear a mask at all times, unless under the age of 2, have trouble breathing or are unconscious, incapacitated or otherwise unable to remove the cover without assistance.  Go directly to the nursing station in the unit you are visiting and do not linger in public areas.  Check in at the nursing station before going to the patient's room.  Maintain a physical distance of six feet from all others.  Follow elevator restrictions to four riding at a time - peak times are 6:30-7:30 a.m., noon and 6:30-7:30 p.m.  Be aware cafeteria peak times are 11 a.m. - 1 p.m.  Wash your hands frequently and cover your coughs and sneezes.

## 2021-08-19 NOTE — Progress Notes
[facility] phone triage completed with patient scheduled for breast lumpectomy at [facility] on 08/29/21.  PMH, allergies, and medications reviewed with patient.  Pt. with history of right breast DCIS, asthma, anxiety/depression, and GERD.  Pt. uses Flovent inhaler daily and Montelukast for asthma, stating that her asthma is "well controlled."  She does not smoke, and drinks alcohol "rarely".  She has no history of CVA, MI, or DVT, and is not anticoagulated.  Current visitor policy reviewed with patient.  CBC/CMP dated today "in process".  Preop and medication instructions reviewed with patient, with patient verbalizing understanding - copy placed in MyChart.

## 2021-08-19 NOTE — Progress Notes
Name: Kristy Martinez          MRN: [id number]      DOB: [birth year]      AGE: 60 y.o.   DATE OF SERVICE: 08/19/2021    Subjective:             Reason for Visit:  Heme/Onc Care      Kristy Martinez is a 60 y.o. female presenting for new patient consultation.     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 Kristy Martinez had no breast concerns such as palpable masses, skin changes, or nipple discharge prior to her screening mammogram which revealed a focal asymmetry with associated microcalcifications in the right breast.  Diagnostic mammogram showed a persistent asymmetry with associated fine pleomorphic calcifications in aggregate measuring approximately 2.3 cm.  There was no ultrasound correlate, therefore stereotactic biopsy was performed on 08/11/21 ([place]).  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 She has a history of a left breast biopsy in 2017 that showed a fibroadenoma.  She otherwise has no prior history of breast biopsies or surgeries. She has no major cardiovascular or pulmonary complications.   ?  BREAST IMAGING:  Mammogram:    -- Bilateral screening mammogram 07/22/21 ([place]) revealed no suspicious abnormality is seen in the left breast. In the upper outer right breast, middle/posterior depth, there is a focal asymmetry with associated microcalcifications (MLO slice 30 and CC slice 38). Recommend right diagnostic mammogram and targeted ultrasound for further evaluation.   -- Right diagnostic mammogram 08/08/21 ([place]) revealed a persistent 1.5 cm focal asymmetry with subtle architectural distortion within the upper outer right breast middle-posterior depth at approximately 10:00. The focal asymmetry has associated fine pleomorphic calcifications in aggregate measuring approximately 2.0 x 1.5 x 2.3 cm in the AP, TV and CC dimensions.   Ultrasound:    -- Targeted right breast ultrasound 08/08/21 () revealed no convincing ultrasound correlate is identified for the mammographic focal asymmetry of concern. 5 morphologically normal appearing right axillary lymph nodes are documented. Impression: Suspicious focal asymmetry with associated calcifications in the upper outer right breast at 10:00. Stereotactic/tomosynthesis guided biopsy is recommended.   ?  REPRODUCTIVE HEALTH:  Age at first Menarche: ?55  Age at First Live Birth: ?8  Age at Menopause: ?89, no HRT  Gravida: ?1  Para: 1  Breastfeeding: ?None   ?  PROCEDURE: Pending  PERTINENT PMH:  Asthma  FAMILY HISTORY:    Breast Cancer: Maternal Grandma dx age 63s  Ovarian Cancer: None   Prostate Cancer: Maternal Grandpa dx age 37   PHYSICAL EXAM on PRESENTATION:    MEDICAL ONCOLOGY: Dr. Arliss Journey     REFERRED BY: Dr Steva Ready        Review of Systems   Constitutional: Negative for fever, chills, appetite change and fatigue.   HENT: Negative for hearing loss, congestion, rhinorrhea and tinnitus.    Eyes: Negative for pain, discharge and itching.  Respiratory: Negative for cough, chest tightness and shortness of breath.    Cardiovascular: Negative for chest pain and palpitations.   Gastrointestinal: Negative for abdominal distention, pain, nausea, vomiting, and diarrhea.   Genitourinary: Negative for frequency, vaginal bleeding, difficulty urinating and pelvic pain.   Musculoskeletal: Negative for myalgias, back pain, joint swelling and arthralgias.   Skin: Negative for rash.   Neurological: Negative for dizziness, weakness, light-headedness and headaches.   Hematological: Does not bruise/bleed easily.   Psychiatric/Behavioral: Negative for disturbed wake/sleep cycle. The patient is not nervous/anxious.      Medical History:   Diagnosis Date   ? Asthma      Surgical History:   Procedure Laterality Date   ? TONSILLECTOMY      at age 67    ? TUBAL LIGATION      at age 8     Family History   Problem Relation Age of Onset   ? Arthritis-osteo Mother    ? Thyroid Disease Father    ? Cancer-Breast Paternal Aunt 4   ? Cancer-Breast Maternal Grandmother         91s   ? Cancer-Prostate Maternal Grandfather    ? Diabetes Paternal Grandmother      Social History     Socioeconomic History   ? Marital status: Married   Tobacco Use   ? Smoking status: Never Smoker   ? Smokeless tobacco: Never Used   Substance and Sexual Activity   ? Alcohol use: Not Currently   ? Drug use: Not Currently                 Objective:         ? citalopram (CELEXA) 20 mg tablet Take 20 mg by mouth daily.   ? cyanocobalamin/folic ac/vit B6 (WESTAB MAX PO) Take  by mouth daily.   ? FLOVENT HFA 110 mcg/actuation inhaler Inhale 2 puffs by mouth into the lungs daily.   ? fluticasone propionate (FLONASE) 50 mcg/actuation nasal spray, suspension Apply 2 sprays to each nostril as directed daily. Shake bottle gently before using.   ? LORazepam (ATIVAN) 0.5 mg tablet Take 0.5 mg by mouth at bedtime as needed for Nausea.   ? montelukast (SINGULAIR) 10 mg tablet Take 10 mg by mouth every morning.   ? omeprazole DR (PRILOSEC) 40 mg capsule Take 40 mg by mouth daily before breakfast.     Vitals:    08/19/21 0756   BP: 125/74   BP Source: Arm, Left Upper   Pulse: 91   Temp: 37 ?C (98.6 ?F)   SpO2: 99%   TempSrc: Temporal   PainSc: Zero   Weight: 78 kg (172 lb)   Height: 157 cm (5' 1.81)     Body mass index is 31.65 kg/m?Marland Kitchen     Pain Score: Zero       Fatigue Scale:  (denies concerns)       Physical Exam    Constitutional: Alert, cooperative, NAD  Eyes: non-icteric, no discharge, pupils are equal, round, reactive to light  ENT: NCAT, moist mucous membranes  Cardiovascular: normal rate  Respiratory: non-labored respirations on RA, normal effort, no respiratory distress  GI: abdomen soft, non-distended  Skin: warm, dry, no rashes or erythema, no cyanosis  Musculoskeletal: normal range of motion, no edema  Neuro: alert and orientated, moves all extremities, no cranial nerve deficit  Psych: mildly anxious, behavior is normal, judgment and thought content normal    RIGHT BREAST EXAM:   Breast:  G3  ptosis, no palpable masses. Mild ecchymosis from biopsy  Skin Erythema:  No  Attachment of Overlying Skin:  No  Peau d' orange:  No  Chest Wall Attachment:  No  Nipple Inversion:  No  Nipple Discharge: No    LEFT BREAST EXAM:  Breast: G3 ptosis, no palpable masses.   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 We discussed the multidisciplinary treatment for breast cancer including the role for medical oncology and radiation oncology. I discussed the patient's case today with Dr. Arliss Journey and Dr. Isabella Stalling in [facility] fashion for medical decision making. Surgery will be first in the order of treatment.    We discussed options of BCT versus mastectomy with or without reconstruction, including a similar risk of local/regional recurrence with BCT and mastectomy, with no survival benefit for mastectomy. She is a good candidate for lumpectomy and this is the operation she prefers. With lumpectomy she understands the need for radiation following surgery and the risk of positive margin (5%) which would require additional surgery. We also discussed options for oncoplastic reduction after lumpectomy including breast reduction or mastopexy but she is not interested in this approach.     If we proceed with mastectomy, she is a candidate for [employer] versus TM pending reconstruction goals. We reviewed that with both TM and [facility], the final aesthetic outcome is part of a process and most often patients require additional surgery to complete this process. With respect to [facility], we discussed that we would partner with a plastic surgeon who would be present at the time of her operation to start the reconstruction. She is an oncologic candidate for DTI but the final decision on DTI versus TE is for the plastic surgeon to confirm at the time of her consultation.     We discussed the role for radiation including that breast conservation is usually followed by radiation and that there are several potential indications for post mastectomy radiation. She will meet Dr. Isabella Stalling today for formal discussion and radiation oncology recommendations.    We discussed a SLNB to evaluate for nodal metastasis is not required with lumpectomy. If she were to decide on mastectomy she would require a SLNB. We discussed that if she proceeds with lumpectomy and final pathology results with invasive component, her case would be discussed at tumor board where it could possibly be recommended she have a SNLB which would be a 2nd surgery.     She is not a candidate for the COMET trail given she has grade 3 DCIS.     She was given ample time to ask questions and stated no further questions at the end of the visit.     Additional imaging ordered today: None.   Medical oncology: Referral in system, will meet with Dr. Arliss Journey today  Radiation oncology:Referral in system, will meet with Dr. Isabella Stalling today  Genetic referral: N/A.  Does not meet NCCN guidelines  Plastic surgery referral: Patient Declined  Fertility referral: N/A.   Lymphedema evaluation: N/A.  Tumor board presentation: No     Preoperative clearance required: No additional preoperative clearance required     Operative plan: Right RSL lumpectomy        ATTESTATION    I personally performed the key portions of the E/M visit, discussed case with trainee and concur with documentation of history, physical exam, assessment, and treatment plan unless otherwise noted.    Staff name:  Ignatius Specking, MD FACS Date:  08/19/2021       Based on the Cures Act all results will be immediately released to MyChart.  We will be completing a thorough review of the additional information and putting it in context with the overall diagnosis.  We will call once this has been completed to discuss the results, as well as the next steps in treatment.

## 2021-08-19 NOTE — Pre-Anesthesia Medication Instructions
YOUR MEDICATION LIST     citalopram (CELEXA) 20 mg tablet Take 20 mg by mouth daily.    cyanocobalamin/folic ac/vit B6 (WESTAB MAX PO) Take  by mouth daily.    FLOVENT HFA 110 mcg/actuation inhaler Inhale 2 puffs by mouth into the lungs daily.    fluticasone propionate (FLONASE) 50 mcg/actuation nasal spray, suspension Apply 2 sprays to each nostril as directed daily. Shake bottle gently before using.    LORazepam (ATIVAN) 0.5 mg tablet Take 0.5 mg by mouth at bedtime as needed for Nausea.    montelukast (SINGULAIR) 10 mg tablet Take 10 mg by mouth every morning.    omeprazole DR (PRILOSEC) 40 mg capsule Take 40 mg by mouth daily before breakfast.         YOUR  MEDICATION INSTRUCTIONS FOR SURGERY    Please continue taking your medications as your doctor has told you to UNLESS it is listed to stop below.    14 DAYS BEFORE SURGERY  Stop Multivitamin  Do not  start any new vitamins, herbals, or natural supplements before surgery.      7 DAYS BEFORE SURGERY  DO NOT TAKE the following anti-inflammatory medications such as ibuprofen (Advil, Motrin) and naproxen (Aleve)   You may use acetaminophen (Tylenol)        DAY BEFORE SURGERY  TAKE your medications the day before surgery as usual unless told differently.      MORNING OF SURGERY  DO NOT take these medications:  Remaining vitamins/supplements  Ointments/creams/lotions       TAKE these medications with a sip (1-2 ounces) of water:  Citalopram  Montelukast  Omeprazole  Use all inhalers, nasal sprays, and eye drops as usual  May take if needed:  Flonase nasal spray  Tylenol     Please contact the clinic pharmacist with any changes to your medication list or medication questions before surgery.  E-mail:  [email or web address]   Phone: [phone]     Before going home from the hospital, please ask your doctor when you should re-start any medicine that was stopped for surgery.

## 2021-08-19 NOTE — Progress Notes
Name: Kristy Martinez          MRN: [id number]      DOB: 1961/07/17      AGE: 60 y.o.   DATE OF SERVICE: 08/19/2021    Subjective:             Reason for Visit:  Follow Up      Kristy Martinez is a 60 y.o. female.     Kristy Martinez is a very pleasant 60 year old postmenopausal woman who presents to [facility] for consultation regarding newly diagnosed right breast DCIS.she had regular screening mammogramhich revealed a focal asymmetry with associated microcalcifications in the right breast.  Diagnostic mammogram showed a persistent asymmetry with associated fine pleomorphic calcifications in aggregate measuring approximately 2.3 cm.  There was no ultrasound correlate, therefore stereotactic biopsy was performed on 08/11/21 ([place]).  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  ER positive PR positive  She did not have any mastalgia nipple discharge prior to diagnosis    BREAST IMAGING:  Mammogram:    -- Bilateral screening mammogram 07/22/21 ([place]) revealed no suspicious abnormality is seen in the left breast. In the upper outer right breast, middle/posterior depth, there is a focal asymmetry with associated microcalcifications (MLO slice 30 and CC slice 38). Recommend right diagnostic mammogram and targeted ultrasound for further evaluation.   -- Right diagnostic mammogram 08/08/21 ([place]) revealed a persistent 1.5 cm focal asymmetry with subtle architectural distortion within the upper outer right breast middle-posterior depth at approximately 10:00. The focal asymmetry has associated fine pleomorphic calcifications in aggregate measuring approximately 2.0 x 1.5 x 2.3 cm in the AP, TV and CC dimensions.   Ultrasound:    -- Targeted right breast ultrasound 08/08/21 ([place]) revealed no convincing ultrasound correlate is identified for the mammographic focal asymmetry of concern. 5 morphologically normal appearing right axillary lymph nodes are documented. Impression: Suspicious focal asymmetry with associated calcifications in the upper outer right breast at 10:00. Stereotactic/tomosynthesis guided biopsy is recommended.   ?  REPRODUCTIVE HEALTH:  Age at first Menarche: ?55  Age at First Live Birth: ?22  Age at Menopause: ?51, no HRT  Gravida: ?1  Para: 1  Breastfeeding: ?None   ?        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 citalopram (CELEXA) 20 mg tablet Take 20 mg by mouth daily.   ? cyanocobalamin/folic ac/vit B6 (WESTAB MAX PO) Take  by mouth daily.   ? FLOVENT HFA 110 mcg/actuation inhaler Inhale 2 puffs by mouth into the lungs daily.   ? fluticasone propionate (FLONASE) 50 mcg/actuation nasal spray, suspension Apply 2  sprays to each nostril as directed daily. Shake bottle gently before using.   ? LORazepam (ATIVAN) 0.5 mg tablet Take 0.5 mg by mouth at bedtime as needed for Nausea.   ? montelukast (SINGULAIR) 10 mg tablet Take 10 mg by mouth every morning.   ? omeprazole DR (PRILOSEC) 40 mg capsule Take 40 mg by mouth daily before breakfast.     Vitals:    08/19/21 0804   BP: 125/74   Pulse: 91   Temp: 37 ?C (98.6 ?F)   SpO2: 99%   PainSc: Zero   Weight: 78 kg (172 lb)   Height: 157 cm (5' 1.81)     Body mass index is 31.65 kg/m?Marland Kitchen     Pain Score: Zero            Pain Addressed:  N/A    Patient Evaluated for a Clinical Trial: Patient not eligible for a treatment trial (including not needing treatment, needs palliative care, in remission).     [place] Cooperative Oncology Group performance status is 0, Fully active, able to carry on all pre-disease performance without restriction.Marland Kitchen     Physical Exam  Vitals and nursing note reviewed.   Constitutional:       Appearance: Normal appearance. She is well-developed.   Eyes:      General: No scleral icterus.  Cardiovascular:      Rate and Rhythm: Normal rate and regular rhythm.   Pulmonary:      Breath sounds: Normal breath sounds.   Chest:   Breasts:      Right: No mass.      Left: No mass.       Abdominal:      General: Bowel sounds are normal.      Palpations: Abdomen is soft.   Musculoskeletal:      Cervical back: Neck supple.   Lymphadenopathy:      Upper Body:      Right upper body: No axillary adenopathy.      Left upper body: No axillary adenopathy.   Neurological:      Mental Status: She is alert.          Estrogen Receptor (ER): ? ? ? ? ?70% ? ? Positive ? ? Stain Intensity:   Strong   Progesterone Receptor (PR): ? ? 10% ? ? Positive ? ? Stain Intensity:   Moderate     A. 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,   cribriform and solid type, with calcifications and comedonecrosis. ?      Assessment and Plan:  Right breast DCIS, ER positive PR positive  We discussed prognosis natural history and treatment options of DCIS  We discussed multimodality treatment of DCIS  Discussed chemoprevention with tamoxifen denies  Benefit of 5 years of endocrine therapy to decrease risk of second breast cancer discussed  I discussed side effects of AI's  She is not sure if she is interested in adjuvant endocrine therapy.  But wants to think about it    I will see her after surgery for final review pathology and recommendations of adjuvant endocrine therapy   We discussed this case with Dr. Cyril Mourning and Dr. Isabella Stalling     Encounter Diagnoses   Name Primary?   ? Ductal carcinoma in situ (DCIS) of right breast Yes            time spent on this visit 45 min

## 2021-08-22 NOTE — Progress Notes
I spoke with Kristy Martinez this afternoon in regards to her scheduled Right Breast Radioactive Seed Localization procedure on 08/26/21 at 1100 in Breast Imaging.   She was educated on how to prepare for the Radioactive Seed Localization, and what to expect during and after the procedure.   She stated that her surgery date is 08/29/21.   I reviewed with her that she needed to be at Breast Imaging 1 hour prior to the appointment time.  She verbalized understanding in regards to all instructions and did not have any questions.

## 2021-08-26 ENCOUNTER — Encounter: Admit: 2021-08-26 | Discharge: 2021-08-26 | Payer: BC Managed Care – PPO

## 2021-08-26 ENCOUNTER — Ambulatory Visit: Admit: 2021-08-26 | Discharge: 2021-08-26 | Payer: BC Managed Care – PPO

## 2021-08-26 DIAGNOSIS — D0511 Intraductal carcinoma in situ of right breast: Secondary | ICD-10-CM

## 2021-08-26 MED ORDER — LIDOCAINE 1% (BUFFERED) SYRINGE (BREAST CENTER)
5 mL | Freq: Once | INTRAMUSCULAR | 0 refills | Status: CP
Start: 2021-08-26 — End: ?

## 2021-08-26 NOTE — Progress Notes
Kristy Martinez is here today for a Mammo guided Right Breast Seed Placement. Education and risk of procedure discussed.  Consent will be signed with Dr. Mickeal Skinner and breast marked per protocol.

## 2021-08-29 ENCOUNTER — Encounter: Admit: 2021-08-29 | Discharge: 2021-08-29 | Payer: BC Managed Care – PPO

## 2021-08-29 ENCOUNTER — Ambulatory Visit: Admit: 2021-08-29 | Discharge: 2021-08-29 | Payer: BC Managed Care – PPO

## 2021-08-29 MED ORDER — PROPOFOL 10 MG/ML IV EMUL 20 ML (INFUSION)(AM)(OR)
INTRAVENOUS | 0 refills | Status: DC
Start: 2021-08-29 — End: 2021-08-29
  Administered 2021-08-29: 18:00:00 25 ug/kg/min via INTRAVENOUS

## 2021-08-29 MED ORDER — DEXMEDETOMIDINE IN 0.9 % NACL 20 MCG/5 ML (4 MCG/ML) IV SYRG
INTRAVENOUS | 0 refills | Status: DC
Start: 2021-08-29 — End: 2021-08-29
  Administered 2021-08-29: 19:00:00 8 ug via INTRAVENOUS

## 2021-08-29 MED ORDER — ONDANSETRON HCL (PF) 4 MG/2 ML IJ SOLN
INTRAVENOUS | 0 refills | Status: DC
Start: 2021-08-29 — End: 2021-08-29
  Administered 2021-08-29: 19:00:00 4 mg via INTRAVENOUS

## 2021-08-29 MED ORDER — MIDAZOLAM 1 MG/ML IJ SOLN
INTRAVENOUS | 0 refills | Status: DC
Start: 2021-08-29 — End: 2021-08-29
  Administered 2021-08-29: 18:00:00 2 mg via INTRAVENOUS

## 2021-08-29 MED ORDER — ARTIFICIAL TEARS (PF) SINGLE DOSE DROPS GROUP
OPHTHALMIC | 0 refills | Status: DC
Start: 2021-08-29 — End: 2021-08-29
  Administered 2021-08-29: 18:00:00 2 [drp] via OPHTHALMIC

## 2021-08-29 MED ORDER — LIDOCAINE (PF) 200 MG/10 ML (2 %) IJ SYRG
INTRAVENOUS | 0 refills | Status: DC
Start: 2021-08-29 — End: 2021-08-29
  Administered 2021-08-29: 18:00:00 60 mg via INTRAVENOUS

## 2021-08-29 MED ORDER — DEXAMETHASONE SODIUM PHOSPHATE 4 MG/ML IJ SOLN
INTRAVENOUS | 0 refills | Status: DC
Start: 2021-08-29 — End: 2021-08-29
  Administered 2021-08-29: 18:00:00 4 mg via INTRAVENOUS

## 2021-08-29 MED ORDER — PHENYLEPHRINE HCL IN 0.9% NACL 1 MG/10 ML (100 MCG/ML) IV SYRG
INTRAVENOUS | 0 refills | Status: DC
Start: 2021-08-29 — End: 2021-08-29
  Administered 2021-08-29: 19:00:00 50 ug via INTRAVENOUS

## 2021-08-29 MED ORDER — PROPOFOL INJ 10 MG/ML IV VIAL
INTRAVENOUS | 0 refills | Status: DC
Start: 2021-08-29 — End: 2021-08-29
  Administered 2021-08-29: 18:00:00 170 mg via INTRAVENOUS

## 2021-08-29 MED ORDER — FENTANYL CITRATE (PF) 50 MCG/ML IJ SOLN
INTRAVENOUS | 0 refills | Status: DC
Start: 2021-08-29 — End: 2021-08-29
  Administered 2021-08-29 (×4): 25 ug via INTRAVENOUS

## 2021-08-29 MED ADMIN — LACTATED RINGERS IV SOLP [4318]: 1000.000 mL | INTRAVENOUS | @ 16:00:00 | Stop: 2021-08-29 | NDC 00338011704

## 2021-08-29 MED ADMIN — ACETAMINOPHEN 500 MG PO TAB [102]: 1000 mg | ORAL | @ 16:00:00 | Stop: 2021-08-29 | NDC 00904673080

## 2021-08-29 MED ADMIN — CEFAZOLIN INJ 1GM IVP [210319]: 2 g | INTRAVENOUS | @ 18:00:00 | Stop: 2021-08-29 | NDC 60505614200

## 2021-08-29 MED ADMIN — OXYCODONE 5 MG PO TAB [10814]: 10 mg | ORAL | @ 20:00:00 | Stop: 2021-08-29 | NDC 00904696661

## 2021-08-29 MED ADMIN — BUPIVACAINE HCL 0.25 % (2.5 MG/ML) IJ SOLN [1222]: 10 mL | INTRAMUSCULAR | @ 19:00:00 | Stop: 2021-08-29 | NDC 63323046557

## 2021-08-29 MED FILL — OXYCODONE 5 MG PO TAB: 5 mg | ORAL | 2 days supply | Qty: 10 | Fill #1 | Status: CP

## 2021-08-31 ENCOUNTER — Encounter: Admit: 2021-08-31 | Discharge: 2021-08-31 | Payer: BC Managed Care – PPO

## 2021-08-31 DIAGNOSIS — J45909 Unspecified asthma, uncomplicated: Secondary | ICD-10-CM

## 2021-08-31 DIAGNOSIS — D0511 Intraductal carcinoma in situ of right breast: Secondary | ICD-10-CM

## 2021-08-31 DIAGNOSIS — K219 Gastro-esophageal reflux disease without esophagitis: Secondary | ICD-10-CM

## 2021-09-09 ENCOUNTER — Encounter: Admit: 2021-09-09 | Discharge: 2021-09-09 | Payer: BC Managed Care – PPO

## 2021-09-09 ENCOUNTER — Ambulatory Visit: Admit: 2021-09-09 | Discharge: 2021-09-09 | Payer: BC Managed Care – PPO

## 2021-09-09 DIAGNOSIS — Z4889 Encounter for other specified surgical aftercare: Secondary | ICD-10-CM

## 2021-09-09 DIAGNOSIS — K219 Gastro-esophageal reflux disease without esophagitis: Secondary | ICD-10-CM

## 2021-09-09 DIAGNOSIS — D0511 Intraductal carcinoma in situ of right breast: Secondary | ICD-10-CM

## 2021-09-09 DIAGNOSIS — J45909 Unspecified asthma, uncomplicated: Secondary | ICD-10-CM

## 2021-09-09 MED ORDER — CEFAZOLIN INJ 1GM IVP
2 g | Freq: Once | INTRAVENOUS | 0 refills
Start: 2021-09-09 — End: ?

## 2021-09-09 NOTE — Pre-Anesthesia Medication Instructions
YOUR MEDICATION LIST     acetaminophen (TYLENOL) 325 mg tablet Take two tablets by mouth every 6 hours. Take scheduled for 3 days after surgery, then as needed. Do not exceed 4,000mg  in a 24 hour period.    citalopram (CELEXA) 20 mg tablet Take 20 mg by mouth daily.    FLOVENT HFA 110 mcg/actuation inhaler Inhale 2 puffs by mouth into the lungs daily.    fluticasone propionate (FLONASE) 50 mcg/actuation nasal spray, suspension Apply 2 sprays to each nostril as directed daily. Shake bottle gently before using.    LORazepam (ATIVAN) 0.5 mg tablet Take 0.5 mg by mouth at bedtime as needed for Nausea.    montelukast (SINGULAIR) 10 mg tablet Take 10 mg by mouth every morning.    omeprazole DR (PRILOSEC) 40 mg capsule Take 40 mg by mouth daily before breakfast.         YOUR  MEDICATION INSTRUCTIONS FOR SURGERY    Please continue taking your medications as your doctor has told you to UNLESS it is listed to stop below.    14 DAYS BEFORE SURGERY  Do not  start any new vitamins, herbals, or natural supplements before surgery.      7 DAYS BEFORE SURGERY  DO NOT TAKE the following anti-inflammatory medications such as ibuprofen (Advil, Motrin) and naproxen (Aleve)   You may use acetaminophen (Tylenol)       DAY BEFORE SURGERY  TAKE your medications the day before surgery as usual unless told differently.      MORNING OF SURGERY  DO NOT take these medications:  Remaining vitamins/supplements  Ointments/creams/lotions       TAKE these medications with a sip (1-2 ounces) of water:  Citalopram  Flovent inhaler  Montelukast  Omeprazole  Use all inhalers, nasal sprays, and eye drops as usual  May take if needed:  Lorazepam  Tylenol     Please contact the clinic pharmacist with any changes to your medication list or medication questions before surgery.  E-mail:  [email or web address]   Phone: [phone]     Before going home from the hospital, please ask your doctor when you should re-start any medicine that was stopped for surgery.

## 2021-09-09 NOTE — Progress Notes
HPI:  Kristy Martinez is 10 days s/p right RSL lumpectomy for history of right grade 2-3 DCIS (ER 70%, PR 10%) at 10:00, dx 07/2021 .  She is doing well and her pain is controlled without narcotic pain medication.     PHYSICAL EXAM:    Breast:  right    Incision:  C/d/i    Erythema:  No    Ecchymosis:  No    Seroma:  No    PATHOLOGY:       Final Diagnosis:     A. Breast, right breast radioactive seed lumpectomy, long stitch lateral,   short stitch superior, 1 seed, lumpectomy:   Invasive ductal carcinoma, histologic grade II. ?   Ductal carcinoma in situ (DCIS), high nuclear grade, with   comedonecrosis and microcalcifications.   Previous biopsy site changes.   See comment.   ? ? ?   B. Breast, right breast superior margin, ink marks true margin,   excision: ?   Negative for malignancy.     C. Breast, right breast lateral margin, ink marks true margin, excision:   ?   Negative for malignancy. ?     D. Breast, right breast anterior margin, ink marks true margin,   excision: ?   Negative for malignancy. ?     E. Breast, right breast posterior margin, ink marks true margin,   excision: ? ?   Negative for malignancy. ?     F. Breast, right breast medial margin, ink marks true margin, excision:     Ductal carcinoma in situ (DCIS), high nuclear grade, less than 1 mm   to true margin.     G. Breast, right breast inferior margin, ink marks true margin,   excision: ? ?   Negative for malignancy.     Comment:   INVASIVE CARCINOMA OF THE BREAST: RESECTION   CAP Version: Invasive Breast 4.7.0.0     Procedure: Lumpectomy   Laterality: Right   Tumor Site: 10:00   Histologic Type: Invasive ductal carcinoma   Histologic Grade (Nottingham Histologic Score): II/III   Tubular Differentiation: 3   Nuclear Grade: 3   Mitotic Rate (40x objective): 1   Total Nottingham Score: 7/9   Size of Invasive Component: 0.8 x 0.2 cm   Surgical Margins:   Lumpectomy   Main lumpectomy specimen:   Invasive carcinoma: All margins are greater than 2 mm.   DCIS: The closest margin is less than 1 mm from anterior margin and   lateral margin. All remaining margins are greater than 2 mm.     Final shave margins:   Additional right breast medial margin (part F) contains DCIS   which is less than 1 mm from true margin.   Extent of margin involvement if positive or less than 1 mm: ?   Number of foci: 1; extent of each focus: 0.5 mm   All remaining margins are negative for invasive and in-situ   carcinoma.     Ductal Carcinoma In-situ (DCIS): Present   Extensive intraductal component (>25%): Present, 1.6 cm   DCIS within and/or adjacent to invasive carcinoma: Yes ?   DCIS separate from invasive carcinoma: No ?   Lobular Carcinoma In-situ (LCIS): Not identified   Lymph-Vascular Invasion: Not identified   Nipple Involvement: Not applicable   Skin Involvement: Not applicable   Skeletal Muscle: Not present   Regional Lymph Node Sampling: No lymph node sampling   Treatment Effect in the Breast: No known presurgical therapy  Distant Site(s) Involved, if applicable: Not applicable   Biomarker Testing: Ordered. The results will be issued as an addendum.   Time between tumor removal and placement into formalin < 1 hour: Yes   Fixation Time between 6-72 hours: Yes   Pathologic Staging (pTNM, AJCC 8th edition): ?pT1b N not assigned     TNM Descriptors (select all that apply)   Not applicable     Primary Tumor (Invasive Carcinoma) (pT)   pT1b: ? Tumor greater than 5 mm but less than or equal to 10 mm in   greatest dimension     Regional Lymph Nodes (pN)   pN: ?not assigned (no nodes submitted or found)       Testing performed on block number: [id number]   Estrogen Receptor?(ER): ? ? ? ? ?<1% ? ? Negative ? ? ?Stain Intensity: Not Applicable   Note: Internal control cells present and stain as expected.   Progesterone Receptor (PR): ? ? 0% ? ? Negative ? ? ?Stain Intensity: Not Applicable   Note: Internal control cells present and stain as expected.   HER2: ? ? ? ? ? ? ? ? ? ?3+ ? ? Positive     Ki-67 cannot be accurately evaluated due to low number of lesional cells   and dense lymphocytic infiltrate.       ASSESSMENT/PLAN:    60 y/o female 10 days s/p right RSL lumpectomy for history of right grade 2-3 DCIS (ER 70%, PR 10%) at 10:00, dx 07/2021 upgraded to grade 2 IDC (ER < 1%, PR 0%, HER2 3+) on surgical pathology.  Stage IB, pT1bNx    The pathology was reviewed with Kristy Martinez and she was given a copy of the results.  She has clear margins, and multidisciplinary review with myself and Dr. Isabella Stalling agreed no additional surgery is indicated given negative margins in the setting of invasive disease  We will review her case at our multi-disciplinary breast tumor conference next week for consensus recommendations regarding surgical axillary staging. I do anticipate that SLN will be recommended, so she is aware, but we will confirm with tumor board.  If so, we will plan for SLNB with no frozen and no ALND. We will follow up with her with the results. We will plan for port placement at the time of SLNB if this is the route we take pending tumor board consultation/recommendations.  Continue postoperative restrictions until 4 weeks after surgery including no heavy lifting, no strenuous exercise, no swimming/soaking/hot tubs/submersion.  Follow up with Dr. Arliss Journey on 09/16/21 as scheduled for recommendations regarding adjuvant systemic therapy.  Discussed chemotherapy with HER2 blockade is likely going to be recommended in the setting of hormone negative/HER2 positive disease but final recommendation will come via tumor board and Dr. Arliss Journey.  Follow up with Dr. Isabella Stalling from radiation therapy today as scheduled to discuss adjuvant radiation therapy.  This will take place following completion of her chemotherapy.  Follow up in the lymphedema clinic for education and BIS surveillance if we do proceed with SLNB.  RTC 1-2 weeks post op or in 9 months pending tumor board recommendations.  New baseline bilateral diagnostic mammogram 6 months after completion of her radiation therapy.      Scarlett Presto, PA-C     ATTESTATION     I personally reviewed images, performed a history and breast exam, and completed a discussion, assessment, and treatment plan. I have reviewed the initial history and plan outlined by the APP.  I have personally  documented additional history, physical exam, and plan in the note above.       Payton Doughty, MD FACS      Based on the [place] all results will be immediately released to [place].  We will be completing a thorough review of the additional information and putting it in context with the overall diagnosis.  We will update the patient once this has been completed to discuss the results, as well as the next steps in treatment.

## 2021-09-09 NOTE — Progress Notes
[facility] phone triage completed with patient scheduled for sentinel lymph node biopsy at [facility] on 09/19/21.  Pt. just had right breast lumpectomy at [facility] on 08/29/21, with no anesthesia complications noted (ASA=2).  Pt. states no changes in functional or medical status since previous surgery.  Denies CP/DOE, and states that she is able to climb two flights of stairs without difficulty.  PMH includes right breast DCIS, asthma, GERD, and anxiety/depression.  Preop and medication instructions reviewed with patient, with patient verbalizing understanding - copy placed in MyChart.

## 2021-09-09 NOTE — Pre-Anesthesia Patient Instructions
GENERAL INFORMATION    Before you come to the hospital  If you are having an outpatient procedure, you will need to arrange for a responsible ride/person to accompany you home due to sedation or anesthesia with your procedure. A responsible person is a person who has the ability to identify a change in the patient's status and notify medical personnel.  This is typically a family member or friend.  Public transportation is permitted if you have a responsible person to accompany you.  An [employer] Goad, taxi or other public transportation driver is not considered a responsible person to accompany you home.  Bath/Shower Instructions  Take a bath or shower with antibacterial soap the night before or the morning of your procedure. Use clean towels.  Put on clean clothes after bath or shower.  Avoid using lotion and oils.  If you are having surgery above the waist, wear a shirt that fastens up the front.  Sleep on clean sheets if bath or shower is done the night before procedure.  Leave money, credit cards, jewelry, and any other valuables at home. The [facility] is not responsible for the loss or breakage of personal items.  Remove nail polish, makeup and all jewelry (including piercings) before coming to the hospital.  The morning of your procedure:  brush your teeth and tongue  do not smoke, vape, chew or use any tobacco products  do not shave the area where you will have surgery    What to bring to the hospital  ID/ Insurance Card  Medical Device card  Official documents for legal guardianship   Copy of your Living Will, Advanced Directives, and/or Durable Power of Attorney.  If you have these documents, please bring them to the admissions office on the day of your surgery to be scanned into your records.  Small bag with a few personal belongings  CPAP/BiPAP machine (including all supplies)  Walker, cane, or motorized scooter  Cases for glasses/hearing aids/contact lens (bring solutions for contacts)  Dress in clean, loose, comfortable clothing     Eating or drinking before surgery  Do not eat or drink anything after 11:00 p.m. the day before your procedure (including gum, mints, candy, or chewing tobacco) OR follow the specific instructions you were given by your Surgeon.  You may have WATER ONLY up to 2 hours before arriving at the hospital.     Other instructions  Notify your surgeon if:  there is a possibility that you are pregnant  you become ill with a cough, fever, sore throat, nausea, vomiting or flu-like symptoms  you have any open wounds/sores that are red, painful, draining, or are new since you last saw the doctor  you need to cancel your procedure    Notify us at [place]: [phone]  if you need to cancel your procedure  if you are going to be late    Arrival at the hospital  [facility]  [address]  [place], [place] [address]    This is at the [address] of [address]  Use the main entrance of the hospital, at the [facility] corner of the building. Parking is free.  Check-in for surgery is inside the main entrance.  If you are a woman between the ages of 76 and 36, and have not had a hysterectomy, you will be asked for a urine sample prior to surgery.  Please do not urinate before arriving in the Surgery Waiting Room.  Once there, check in and let the attendant know if you need to provide a sample.    Your surgery is scheduled on *** at ***. Your arrival time is ***          For the safety of all patients, visitors and staff as we work to contain COVID-19, we must restrict patient visitors.    Current Visitor Policy (06/20/21):    Our current, and ongoing, visitor rules in surgery and procedural areas are:    2 visitors per patient will be allowed to accompany the patient and wait in the Waiting Room.  1 visitor will be allowed into the pre-op and post-op areas - Timing of this is at the discretion of the RN and will likely be upon completion of all pre-op activities and post-op assessments.     Patients in inpatient and pediatric units, Emergency Department, ambulatory clinics and lab appointments may only have two visitors.       For inpatient stays, patients may have 2 visitors at a time at their bedside. The two visitors can change throughout the day, but no more than two at a time may be bedside.  The policy applies to [facility] [place], [address], [place] and [place] campuses and clinics.    Exceptions include:  No visitors allowed for patients with active COVID-19 infections.  Children younger than age 40 are allowed to visit inpatients.  Two parents/guardians are allowed for surgical or procedural patients younger than 62 years old.  Adult inpatients in semiprivate rooms may have visitors, but visits should be coordinated so only two total visitors are in a room at a time due to space limitations.    Visitors must be free of fever and symptoms to be in our facilities. We ask visitors to follow these guidelines:  Wear a mask at all times, unless under the age of 2, have trouble breathing or are unconscious, incapacitated or otherwise unable to remove the cover without assistance.  Go directly to the nursing station in the unit you are visiting and do not linger in public areas.  Check in at the nursing station before going to the patient's room.  Maintain a physical distance of six feet from all others.  Follow elevator restrictions to four riding at a time - peak times are 6:30-7:30 a.m., noon and 6:30-7:30 p.m.  Be aware cafeteria peak times are 11 a.m. - 1 p.m.  Wash your hands frequently and cover your coughs and sneezes.

## 2021-09-11 ENCOUNTER — Encounter: Admit: 2021-09-11 | Discharge: 2021-09-11 | Payer: BC Managed Care – PPO

## 2021-09-11 DIAGNOSIS — J45909 Unspecified asthma, uncomplicated: Secondary | ICD-10-CM

## 2021-09-11 DIAGNOSIS — K219 Gastro-esophageal reflux disease without esophagitis: Secondary | ICD-10-CM

## 2021-09-11 DIAGNOSIS — D0511 Intraductal carcinoma in situ of right breast: Secondary | ICD-10-CM

## 2021-09-12 ENCOUNTER — Encounter: Admit: 2021-09-12 | Discharge: 2021-09-12 | Payer: BC Managed Care – PPO

## 2021-09-12 NOTE — Telephone Encounter
Pt scheduled for Port Insert with Dr. Sherryl Manges on 09/19/21 at [facility].    Instructed use of anesthesia vs. local only would be determined the morning of surgery.    Instructions given, pt states understanding.

## 2021-09-13 ENCOUNTER — Encounter: Admit: 2021-09-13 | Discharge: 2021-09-13 | Payer: BC Managed Care – PPO

## 2021-09-13 DIAGNOSIS — J45909 Unspecified asthma, uncomplicated: Secondary | ICD-10-CM

## 2021-09-13 DIAGNOSIS — D0511 Intraductal carcinoma in situ of right breast: Secondary | ICD-10-CM

## 2021-09-13 DIAGNOSIS — K219 Gastro-esophageal reflux disease without esophagitis: Secondary | ICD-10-CM

## 2021-09-15 ENCOUNTER — Encounter: Admit: 2021-09-15 | Discharge: 2021-09-15 | Payer: BC Managed Care – PPO

## 2021-09-15 NOTE — Telephone Encounter
I spoke with Kristy Martinez via phone today to confirm tumor board recommendations - no additional breast surgery needed, SLN is confirmed to be recommended as will have implications for systemic therapy, port placement is recommended given she will require chemotherapy, final chemotherapy regimen/recommendations will be determined once SLN pathology results have returned. She asked appropriate questions and is in agreement with SLN + port placement on 09/19/21 as scheduled.     Payton Doughty, MD

## 2021-09-16 ENCOUNTER — Encounter: Admit: 2021-09-16 | Discharge: 2021-09-16 | Payer: BC Managed Care – PPO

## 2021-09-16 DIAGNOSIS — D0511 Intraductal carcinoma in situ of right breast: Secondary | ICD-10-CM

## 2021-09-16 DIAGNOSIS — K219 Gastro-esophageal reflux disease without esophagitis: Secondary | ICD-10-CM

## 2021-09-16 DIAGNOSIS — Z5111 Encounter for antineoplastic chemotherapy: Secondary | ICD-10-CM

## 2021-09-16 DIAGNOSIS — J45909 Unspecified asthma, uncomplicated: Secondary | ICD-10-CM

## 2021-09-19 ENCOUNTER — Encounter: Admit: 2021-09-19 | Discharge: 2021-09-19 | Payer: BC Managed Care – PPO

## 2021-09-19 ENCOUNTER — Ambulatory Visit: Admit: 2021-09-19 | Discharge: 2021-09-19 | Payer: BC Managed Care – PPO

## 2021-09-19 DIAGNOSIS — J45909 Unspecified asthma, uncomplicated: Secondary | ICD-10-CM

## 2021-09-19 DIAGNOSIS — D0511 Intraductal carcinoma in situ of right breast: Secondary | ICD-10-CM

## 2021-09-19 DIAGNOSIS — K219 Gastro-esophageal reflux disease without esophagitis: Secondary | ICD-10-CM

## 2021-09-19 MED ORDER — FENTANYL CITRATE (PF) 50 MCG/ML IJ SOLN
INTRAVENOUS | 0 refills | Status: DC
Start: 2021-09-19 — End: 2021-09-19
  Administered 2021-09-19: 16:00:00 25 ug via INTRAVENOUS
  Administered 2021-09-19: 16:00:00 50 ug via INTRAVENOUS
  Administered 2021-09-19: 15:00:00 25 ug via INTRAVENOUS

## 2021-09-19 MED ORDER — ACETAMINOPHEN 1,000 MG/100 ML (10 MG/ML) IV SOLN
INTRAVENOUS | 0 refills | Status: DC
Start: 2021-09-19 — End: 2021-09-19
  Administered 2021-09-19: 17:00:00 1000 mg via INTRAVENOUS

## 2021-09-19 MED ORDER — LIDOCAINE (PF) 200 MG/10 ML (2 %) IJ SYRG
INTRAVENOUS | 0 refills | Status: DC
Start: 2021-09-19 — End: 2021-09-19
  Administered 2021-09-19: 16:00:00 80 mg via INTRAVENOUS

## 2021-09-19 MED ORDER — DEXAMETHASONE SODIUM PHOSPHATE 4 MG/ML IJ SOLN
INTRAVENOUS | 0 refills | Status: DC
Start: 2021-09-19 — End: 2021-09-19
  Administered 2021-09-19: 16:00:00 4 mg via INTRAVENOUS

## 2021-09-19 MED ORDER — RP DX TC-99M SULF COLL MCI
.5 | Freq: Once | INTRAVENOUS | 0 refills | Status: CP
Start: 2021-09-19 — End: ?
  Administered 2021-09-19: 14:00:00 0.5 via INTRAVENOUS

## 2021-09-19 MED ORDER — PROPOFOL INJ 10 MG/ML IV VIAL
INTRAVENOUS | 0 refills | Status: DC
Start: 2021-09-19 — End: 2021-09-19
  Administered 2021-09-19: 16:00:00 150 mg via INTRAVENOUS

## 2021-09-19 MED ORDER — ARTIFICIAL TEARS (PF) SINGLE DOSE DROPS GROUP
OPHTHALMIC | 0 refills | Status: DC
Start: 2021-09-19 — End: 2021-09-19
  Administered 2021-09-19: 16:00:00 2 [drp] via OPHTHALMIC

## 2021-09-19 MED ORDER — MIDAZOLAM 1 MG/ML IJ SOLN
INTRAVENOUS | 0 refills | Status: DC
Start: 2021-09-19 — End: 2021-09-19
  Administered 2021-09-19: 15:00:00 2 mg via INTRAVENOUS

## 2021-09-19 MED ADMIN — SODIUM CHLORIDE 0.9 % IV SOLP [27838]: 250 mL | @ 16:00:00 | Stop: 2021-09-19 | NDC 00338004904

## 2021-09-19 MED ADMIN — CELECOXIB 200 MG PO CAP [76958]: 200 mg | ORAL | @ 15:00:00 | Stop: 2021-09-19 | NDC 00904650361

## 2021-09-19 MED ADMIN — INDOCYANINE GREEN 25 MG IJ SOLR [82549]: 0.2 mL | INTRAMUSCULAR | @ 16:00:00 | Stop: 2021-09-19 | NDC 17478070102

## 2021-09-19 MED ADMIN — HEPARIN (PORCINE) 1,000 UNIT/ML IJ SOLN [10176]: 3000 [IU] | INTRAVENOUS | @ 16:00:00 | Stop: 2021-09-19 | NDC 63323054015

## 2021-09-19 MED ADMIN — CEFAZOLIN INJ 1GM IVP [210319]: 2 g | INTRAVENOUS | @ 15:00:00 | Stop: 2021-09-19 | NDC 60505614200

## 2021-09-19 MED ADMIN — LACTATED RINGERS IV SOLP [4318]: 1000 mL | INTRAVENOUS | @ 15:00:00 | Stop: 2021-09-19 | NDC 00338011704

## 2021-09-19 MED FILL — OXYCODONE 5 MG PO TAB: 5 mg | ORAL | 2 days supply | Qty: 10 | Fill #1 | Status: CP

## 2021-09-19 NOTE — Anesthesia Pre-Procedure Evaluation
Anesthesia Pre-Procedure Evaluation    Name: Kristy Martinez      MRN: [id number]     DOB: [birth year]     Age: 60 y.o.     Sex: female   _________________________________________________________________________     Procedure Info:   Procedure Information     Date/Time: 09/19/21 1100    Procedures:       IDENTIFICATION SENTINEL LYMPH NODE (Right ) - **Dr. Cyril Mourning Total Case Length with Port placement:  1 hr.  Right SLN @ scheduled time  Injection: IcG      INJECTION RADIOACTIVE TRACER FOR SENTINEL NODE IDENTIFICATION (Right )      Right Sentinel Lymph Node Biopsy (Right Axilla)      INSERTION TUNNELED CENTRAL VENOUS ACCESS DEVICE WITH SUBCUTANEOUS PORT - AGE 61 YEARS AND OVER      FLUOROSCOPIC GUIDANCE CENTRAL VENOUS ACCESS DEVICE PLACEMENT/ REPLACEMENT/ REMOVAL    Location: [facility] OR 8 / [facility] MAIN OR/PERIOP    Surgeons: Ignatius Specking, MD; Dellia Cloud, DO          Physical Assessment  Vital Signs (last filed in past 24 hours):  BP: 130/88 (10/31 0921)  Temp: 37.3 ?C (99.1 ?F) (10/31 1914)  Pulse: 93 (10/31 0921)  Respirations: 16 PER MINUTE (10/31 0921)  SpO2: 96 % (10/31 0921)  O2 Device: None (Room air) (10/31 0921)  Height: 157.5 cm (5' 2) (10/31 7829)  Weight: 77.7 kg (171 lb 6.4 oz) (10/31 0916)      Patient History   No Known Allergies     Current Medications    Medication Directions   acetaminophen (TYLENOL) 325 mg tablet Take two tablets by mouth every 6 hours as needed for Pain. Indications: take two tabs every 6 hours for the first three days after surgery, then take as needed   acetaminophen (TYLENOL) 325 mg tablet Take two tablets by mouth every 6 hours. Take scheduled for 3 days after surgery, then as needed. Do not exceed 4,000mg  in a 24 hour period.   citalopram (CELEXA) 20 mg tablet Take 20 mg by mouth daily.   FLOVENT HFA 110 mcg/actuation inhaler Inhale 2 puffs by mouth into the lungs daily.   fluticasone propionate (FLONASE) 50 mcg/actuation nasal spray, suspension Apply 2 sprays to each nostril as directed daily. Shake bottle gently before using.   LORazepam (ATIVAN) 0.5 mg tablet Take 0.5 mg by mouth at bedtime as needed for Nausea.   montelukast (SINGULAIR) 10 mg tablet Take 10 mg by mouth every morning.   omeprazole DR (PRILOSEC) 40 mg capsule Take 40 mg by mouth daily before breakfast.   oxyCODONE (ROXICODONE) 5 mg tablet Take one tablet to two tablets by mouth every 6 hours as needed for Pain (1-2 tabs every 6 hours as needed for pain (rated 5/10 or higher). Do NOT exceed 6 tabs in a 24 hour period).   senna/docusate (SENOKOT-S) 8.6/50 mg tablet Take one tablet by mouth twice daily. Indications: 1 tab twice a day while taking narcotic pain medications         Review of Systems/Medical History      Patient summary reviewed  Nursing notes reviewed  Pertinent labs reviewed    PONV Screening: Female gender, Non-smoker and Postoperative opioids  No history of anesthetic complications  No family history of anesthetic complications      Airway - negative        Pulmonary       Not a current smoker  Asthma    no COPD      No sleep apnea      Cardiovascular - negative        Exercise tolerance: >4 METS      Beta Blocker therapy: No      Beta blockers within 24 hours: n/a      No hypertension,       No valvular problems/murmurs      No past MI,       No hx of coronary artery disease      No PTCA      No dysrhythmias      No angina      No dyspnea on exertion      GI/Hepatic/Renal           GERD, well controlled      No hx of liver disease     No renal disease      Neuro/Psych - negative      No seizures      No CVA      Musculoskeletal - negative        No neck pain      Endocrine/Other       No diabetes      No hypothyroidism      No anemia      Malignancy (DCIS right breast)    Constitution - negative   Physical Exam    Airway Findings      Mallampati: II      TM distance: >3 FB      Neck ROM: full      Mouth opening: good      Airway patency: adequate    Dental Findings: Negative      Cardiovascular Findings: Negative      Rhythm: regular      Rate: normal      No murmur    Pulmonary Findings: Negative      Breath sounds clear to auscultation.    Abdominal Findings: Negative      Neurological Findings: Negative      Alert and oriented x 3    Constitutional findings: Negative      No acute distress      Well-developed      Well-nourished       Diagnostic Tests  Hematology:   Lab Results   Component Value Date    HGB 13.9 08/19/2021    HCT 41.8 08/19/2021    PLTCT 349 08/19/2021    WBC 6.4 08/19/2021    NEUT 66 08/19/2021    ANC 4.23 08/19/2021    ALC 1.32 08/19/2021    MONA 8 08/19/2021    AMC 0.53 08/19/2021    EOSA 4 08/19/2021    ABC 0.09 08/19/2021    MCV 90.7 08/19/2021    MCH 30.2 08/19/2021    MCHC 33.3 08/19/2021    MPV 7.9 08/19/2021    RDW 14.4 08/19/2021         General Chemistry:   Lab Results   Component Value Date    NA 140 08/19/2021    K 4.6 08/19/2021    CL 102 08/19/2021    CO2 28 08/19/2021    GAP 10 08/19/2021    BUN 16 08/19/2021    CR 0.79 08/19/2021    GLU 91 08/19/2021    CA 9.0 08/19/2021    ALBUMIN 4.4 08/19/2021    TOTBILI 0.4 08/19/2021      Coagulation: No results found for: PT, PTT,  INR      Anesthesia Plan    ASA score: 2   Plan: general  Induction method: intravenous  NPO status: acceptable      Informed Consent  Anesthetic plan and risks discussed with patient.        Plan discussed with: anesthesiologist, CRNA and surgeon/proceduralist.  Comments: (I have discussed the risks/benefits/possible complications of general anesthesia with the patient.  These have included dental/oral injury/tooth loss during airway manipulation, nausea/vomiting, delayed/prolonged emergence, and possible cardiac or pulmonary complications.  The patient expressed understanding and wishes to proceed with a general anesthetic.)

## 2021-09-19 NOTE — Anesthesia Post-Procedure Evaluation
Post-Anesthesia Evaluation    Name: Kristy Martinez      MRN: [id number]     DOB: 1961-07-08     Age: 60 y.o.     Sex: female   __________________________________________________________________________     Procedure Information     Anesthesia Start Date/Time: 09/19/21 1026    Procedures:       IDENTIFICATION SENTINEL LYMPH NODE (Right Axilla) - **Dr. Cyril Mourning Total Case Length with Port placement:  1 hr.  Right SLN @ scheduled time  Injection: IcG      INJECTION RADIOACTIVE TRACER FOR SENTINEL NODE IDENTIFICATION (Right Breast)      Right Sentinel Lymph Node Biopsy (Right Axilla)      INSERTION TUNNELED CENTRAL VENOUS ACCESS DEVICE WITH SUBCUTANEOUS PORT - AGE 21 YEARS AND OVER (Chest)      FLUOROSCOPIC GUIDANCE CENTRAL VENOUS ACCESS DEVICE PLACEMENT/ REPLACEMENT/ REMOVAL (Chest)    Location: [facility] OR [facility] / [facility] OR/PERIOP    Surgeons: Ignatius Specking, MD; Kennedy Bucker, German L, DO          Post-Anesthesia Vitals  BP: 122/74 (10/31 1220)  Temp: 37 ?C (98.6 ?F) (10/31 1220)  Pulse: 70 (10/31 1220)  Respirations: 18 PER MINUTE (10/31 1220)  SpO2: 97 % (10/31 1220)  O2 Device: None (Room air) (10/31 1220)  Height: 157.5 cm (5' 2) (10/31 5784)   Vitals Value Taken Time   BP 122/74 09/19/21 1220   Temp 37 ?C (98.6 ?F) 09/19/21 1220   Pulse 70 09/19/21 1220   Respirations 18 PER MINUTE 09/19/21 1220   SpO2 97 % 09/19/21 1220   O2 Device None (Room air) 09/19/21 1220   ABP     ART BP           Post Anesthesia Evaluation Note    Evaluation location: pre/post  Patient participation: recovered; patient participated in evaluation  Level of consciousness: alert    Pain score: 0  Pain management: adequate    Hydration: normovolemia  Temperature: 36.0?C - 38.4?C  Airway patency: adequate    Perioperative Events       Post-op nausea and vomiting: no PONV    Postoperative Status  Cardiovascular status: hemodynamically stable  Respiratory status: spontaneous ventilation        Perioperative Events  There were no known complications for this encounter.

## 2021-09-20 ENCOUNTER — Encounter: Admit: 2021-09-20 | Discharge: 2021-09-20 | Payer: BC Managed Care – PPO

## 2021-09-20 DIAGNOSIS — J45909 Unspecified asthma, uncomplicated: Secondary | ICD-10-CM

## 2021-09-20 DIAGNOSIS — K219 Gastro-esophageal reflux disease without esophagitis: Secondary | ICD-10-CM

## 2021-09-20 DIAGNOSIS — D0511 Intraductal carcinoma in situ of right breast: Secondary | ICD-10-CM

## 2021-09-21 ENCOUNTER — Encounter: Admit: 2021-09-21 | Discharge: 2021-09-21 | Payer: BC Managed Care – PPO

## 2021-09-21 DIAGNOSIS — D0511 Intraductal carcinoma in situ of right breast: Secondary | ICD-10-CM

## 2021-09-21 DIAGNOSIS — K219 Gastro-esophageal reflux disease without esophagitis: Secondary | ICD-10-CM

## 2021-09-21 DIAGNOSIS — J45909 Unspecified asthma, uncomplicated: Secondary | ICD-10-CM

## 2021-09-23 NOTE — Progress Notes
Lymphedema Post-Operative Education     DATE:  09/23/2021    PATIENT NAME:   Kristy Martinez  DATE OF BIRTH:   [birth year]  MRN:   [id number]    The patient presents today for post-operative lymphedema education.     Medical Team:   Surgeon: Cyril Mourning  Plastic surgeon: N/A   Medical oncologist: Arliss Journey  Radiation oncologist: Isabella Stalling    Surgical Procedure: 08/29/21 - Right breast seed localized lumpectomy  09/19/21 - Right SLNB (0/3)    BASELINE MEASUREMENTS  BIS = 6.6, Right - completed on 08/19/21    Handedness:  right handed    Risk factors for the development of breast cancer treatment related lymphedema include:   1. Right lumpectomy  2. Right SLNB (0/3)  3. Radiation Therapy - Plan pending  4. Chemotherapy - Plan pending  5. BMI > 30 (31.65)    Lymphedema evaluation, teaching and care plan:     1. General review of lymphedema pathophysiology in breast cancer patients completed. Patient reports no previous history or experience/personal knowledge of lymphedema. No barriers to learning noted, patient willing and active participant.  Very interested in learning about topic.  Thoughtful questions demonstrated good insight into etiology and prevention.      2. Baseline assessment and measurements completed 08/19/21 including L-Dex Bioimpedance test (SOZO).    3. Signs and symptoms to watch for once initial symptoms from surgery have resolved reviewed including but not limited to swelling, aching, heaviness, fullness in at risk area. Symptoms related to cellulitis or DVT should be reported urgently to surgery or medical oncology team.     4. Risk Factors/Precautions:   ? Obesity/weight gain (BMI greater than 30).  Optimal weight management is ideal for overall health.  Exercise without restrictions may be resumed when approved by surgeon.   ? Cancer and cancer treatments including surgery, radiation therapy, and certain types of chemotherapy are risk factors for developing lymphedema.   ? Precautions regarding meticulous skin care reviewed including keeping skin moisturized, protecting skin from burns and injuries, and reporting signs of infection.  Avoidance of blood pressures and needlesticks including medication injections, finger sticks, lab draws, and tattoos on affected arm discussed.   ? Early intervention can often reverse or prevent further swelling. Interventions may include the use of a compression sleeve, at home self-massage, and stretching exercises.    5. When to call the office: Unrelenting aching, heaviness, fullness or swelling in arm or upper chest on the affected side. Signs and symptoms related to cellulitis or DVT should be reported urgently to the surgery or medical oncology team.      6. Lymphedema surveillance: BIS surveillance to be coordinated with routine surgical follow-ups.     Provided patient with written handouts regarding covered materials including contact information should questions or concerns arise.     Total time 30 minutes. Estimated counseling time 25 minutes regarding signs symptoms, risk factors, monitoring, when to contact us and follow up plan.  Patient agreeable to follow up plan.

## 2021-09-30 ENCOUNTER — Encounter: Admit: 2021-09-30 | Discharge: 2021-09-30 | Payer: BC Managed Care – PPO

## 2021-09-30 DIAGNOSIS — K219 Gastro-esophageal reflux disease without esophagitis: Secondary | ICD-10-CM

## 2021-09-30 DIAGNOSIS — D0511 Intraductal carcinoma in situ of right breast: Secondary | ICD-10-CM

## 2021-09-30 DIAGNOSIS — J45909 Unspecified asthma, uncomplicated: Secondary | ICD-10-CM

## 2021-09-30 NOTE — Progress Notes
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 Kennedy Bucker). She reports she is doing well postop without issues or concerns    PHYSICAL EXAM:    Breast:  right    Incision:  CDI    Erythema:  No    Ecchymosis:  No    Seroma:  No  Axilla:    Incision:  CDI    Erythema:  No    Ecchymosis:  No    Seroma:  No    Upper Extremity:    ROM:  intact    Winged scapula: No    Lymphedema:  No     Left chest wall: port site CDI    PATHOLOGY:    PATHOLOGY REPORT   Date Value Ref Range Status   09/19/2021   Final    [facility]  [email or web address]    Department of Pathology and Laboratory Medicine  [address], [place], [place] [address]  Surgical Pathology Office:  [phone]  Fax:  [phone]  SURGICAL PATHOLOGY REPORT    NAME: Kristy, Martinez SURG PATH #: [id number] MR #: [id number] SPECIMEN  CLASS: SI BILLING #: [id number] ALT ID #:  LOCATION: [id number] DATE OF  PROCEDURE: 09/19/2021 AGE:  60 SEX: F DATE RECEIVED: 09/19/2021 DOB:  1961/07/06  TIME RECEIVED:  11:02 PHYSICIAN: Ignatius Specking, MD DATE OF  REPORT: 09/21/2021 COPY TO:  DATE OF PRINTING: 09/21/2021         ########################################################################  Final Diagnosis:    A. Lymph nodes, right axillary sentinel lymph nodes, biopsy:    Three lymph nodes, negative for metastatic carcinoma (0/3).  Deeper sections and pancytokeratin staining support the absence of  carcinoma.       Attestation:  By this signature, I attest that I have personally formulated the final  interpretation expressed in this report and that the above diagnosis is  based upon my examination of the slides and/or other material indicated in  this report.    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         Gross Description:  A. Received fresh, labeled with the patient's name and right axillary  sentinel lymph nodes, are three tan-yellow lymph nodes measuring 0.7-1.2  cm in greatest dimension. The specimen is entirely submitted as follows:  A1      2 possible lymph nodes, bisected (inked black and blue).   A2      1 possible lymph node, serially sectioned.   A3      Remainder of fibroadipose tissue with possible minute lymph nodes.  (ket)    kp/09/19/2021           If immunohistochemical stains and/or in situ hybridization are cited in  this report, the performance characteristics were determined by the  Department of Pathology and Laboratory Medicine of the [facility]) in compliance with CLIA'88  regulations. Some of these tests rely on the use of analyte specific  reagents and are subject to specific labeling requirements by the FDA.  The stains are  performed on formalin-fixed, paraffin-embedded tissue,  unless otherwise stated. Known positive and negative control tissues  demonstrate appropriate staining. Results should be interpreted with  caution given the likelihood of false negativity on decalcified specimens.  This testing was developed by the Department of Pathology and Laboratory  Medicine of the [facility] [facility] [place]. It has not been cleared or approved  by the FDA.  The FDA has determined that such clearance or approval is not  necessary.    Professional services performed by [facility], [place], at [address], [place], [place]  [address]     ]      ASSESSMENT/PLAN:  S/P:  Right RSL lumpectomy + SLN + port placement for pT1N0 right breast cancer.    Copy of pathology report provided and reviewed. Negative SLN therefore surgery is complete.   Okay to start walking and light activities such as vacuuming at this time. Avoid heavy lifting until 4 weeks postop  She has FU set medical oncology to start adjuvant systemic chemotherapy. After this chemotherapy is complete, medical oncology will arrange appropriate radiation oncology follow-up.  LERN today  RTC in 9 months with surgical APP with BIS. Discussed with pt we will not schedule her dx mammograms at the same time as this RTC visit as it may not be ideal timing with respect to radiation, so at the time of her 9 month visit we will identify the appropriate timeframe for her updated diagnostic imaging    Ignatius Specking, MD

## 2021-10-05 ENCOUNTER — Ambulatory Visit: Admit: 2021-10-05 | Discharge: 2021-10-05 | Payer: BC Managed Care – PPO

## 2021-10-05 ENCOUNTER — Encounter: Admit: 2021-10-05 | Discharge: 2021-10-05 | Payer: BC Managed Care – PPO

## 2021-10-05 DIAGNOSIS — Z5111 Encounter for antineoplastic chemotherapy: Secondary | ICD-10-CM

## 2021-10-12 ENCOUNTER — Encounter: Admit: 2021-10-12 | Discharge: 2021-10-12 | Payer: BC Managed Care – PPO

## 2021-10-12 DIAGNOSIS — C50919 Malignant neoplasm of unspecified site of unspecified female breast: Secondary | ICD-10-CM

## 2021-10-12 DIAGNOSIS — K219 Gastro-esophageal reflux disease without esophagitis: Secondary | ICD-10-CM

## 2021-10-12 DIAGNOSIS — D0511 Intraductal carcinoma in situ of right breast: Secondary | ICD-10-CM

## 2021-10-12 DIAGNOSIS — J45909 Unspecified asthma, uncomplicated: Secondary | ICD-10-CM

## 2021-10-12 DIAGNOSIS — C50411 Malignant neoplasm of upper-outer quadrant of right female breast: Secondary | ICD-10-CM

## 2021-10-12 MED ORDER — DEXAMETHASONE 4 MG PO TAB
ORAL_TABLET | INTRAMUSCULAR | 0 refills | 12.50000 days | Status: DC
Start: 2021-10-12 — End: 2021-10-12

## 2021-10-12 MED ORDER — DIPHENHYDRAMINE HCL 50 MG/ML IJ SOLN
50 mg | Freq: Once | INTRAVENOUS | 0 refills
Start: 2021-10-12 — End: ?

## 2021-10-12 MED ORDER — PROCHLORPERAZINE MALEATE 10 MG PO TAB
10 mg | ORAL_TABLET | ORAL | 1 refills | Status: AC | PRN
Start: 2021-10-12 — End: ?

## 2021-10-12 MED ORDER — FAMOTIDINE (PF) 20 MG/2 ML IV SOLN
20 mg | Freq: Once | INTRAVENOUS | 0 refills
Start: 2021-10-12 — End: ?

## 2021-10-12 MED ORDER — TRASTUZUMAB-ANNS IVPB
6 mg/kg | Freq: Once | INTRAVENOUS | 0 refills
Start: 2021-10-12 — End: ?

## 2021-10-12 MED ORDER — DEXAMETHASONE SODIUM PHOS (PF) 10 MG/ML IJ SOLN
10 mg | Freq: Once | INTRAVENOUS | 0 refills
Start: 2021-10-12 — End: ?

## 2021-10-12 MED ORDER — PACLITAXEL 250ML IVPB
80 mg/m2 | Freq: Once | INTRAVENOUS | 0 refills
Start: 2021-10-12 — End: ?

## 2021-10-12 MED ORDER — LORAZEPAM 0.5 MG PO TAB
.5 mg | ORAL_TABLET | ORAL | 0 refills | 12.00000 days | Status: AC | PRN
Start: 2021-10-12 — End: ?

## 2021-10-12 MED ORDER — LIDOCAINE-PRILOCAINE 2.5-2.5 % TP CREA
1 refills | Status: AC
Start: 2021-10-12 — End: ?

## 2021-10-12 MED ORDER — TRASTUZUMAB-ANNS IVPB
8 mg/kg | Freq: Once | INTRAVENOUS | 0 refills
Start: 2021-10-12 — End: ?

## 2021-10-12 NOTE — Patient Instructions
Thank you for coming to see us today.  Please call our office or send a message through MyChart if you have any questions or concerns!                                                                                                                 Care Team:  Dr. Qamar Khan  Stephanie LaFaver, APRN  Karen Bonilauri, RN   Annalisa Colonna, RN     [phone] (Phone: M-F 8am-4pm)  [phone] (Phone: After Hours, Weekends, Holidays)  [phone] (Fax)    [address], [id number] (mailing address)  [address], [address] (physical address)  [place], [place] [address]  [email or web address]

## 2021-10-17 ENCOUNTER — Encounter: Admit: 2021-10-17 | Discharge: 2021-10-17 | Payer: BC Managed Care – PPO

## 2021-10-17 DIAGNOSIS — C50911 Malignant neoplasm of unspecified site of right female breast: Secondary | ICD-10-CM

## 2021-10-17 DIAGNOSIS — R918 Other nonspecific abnormal finding of lung field: Secondary | ICD-10-CM

## 2021-10-17 NOTE — Telephone Encounter
-----   Message from Benancio Deeds, RN sent at 10/17/2021  8:48 AM CST -----  Please assist pt with Ct chest . Please coordinate her CT chest when she is here with her other appointment.   Thank you  Catha Nottingham

## 2021-10-17 NOTE — Progress Notes
Chemotherapy Education    Discussed schedule of chemotherapy, chemotherapy side effects, & supportive care associated with paclitaxel + trastuzumab biotherapy.    Discussed schedule of treatment. Patient will receive treatment as follows:  ? Pre-medications as indicated  ? Trastuzumab over 90 minutes with first dose; 30 minutes with subsequent doses  ? Paclitaxel IV over 1 hours     Discussed adverse effects of chemotherapy, including (but not limited to):   ? Low blood counts (explained associated risk for infection, bleeding, bruising, fatigue, weakness)   ? Nausea and vomiting (explained the purpose of scheduled dexamethasone and pre-chemotherapy administered antiemetics before chemotherapy and the use of PRNs in the event of breakthrough CINV)   ? Potential hypersensitivity (pre-medications will be de-escalated after the first two doses if no reaction to paclitaxel)  ? Hair loss   ? Potential peripheral neuropathy   ? Changes in bowel habits / poor appetite / mouth sores & mucositis / taste changes  ? Fevers or chills with first infusion  ? Explained the rationale behind routine cardiac monitoring with echoardiograms (ECHOs) while on trastuzumab    A serious but uncommon side effect of trastuzumab can be interference with the pumping action of the heart. Cautioned patient that the incidence of heart problems (heart failure) increase in people with heart disease or other risk factors such as radiation to the chest, advancing age, and use of other heart-toxic drugs. Advised patient that heart function will be monitored before receiving trastuzumab and will be monitored closely during treatment. Trastuzumab may be discontinued if symptoms of heart failure appear.    While the incidence of infusion hypersensitivity reactions is infrequent, cautioned patient about this potential and advised patient to report immediately any swelling, burning, pain, or redness at the infusion site, any trouble breathing, or any chest pain.    Patient voiced understanding about the provided information. All questions/concerns addressed at this time. Medication handout(s) provided.    Onalee Hua, PHARMD

## 2021-10-17 NOTE — Progress Notes
I informed patient of her CXR result and recommended to repeat CT of the chest. Ct chest appointment has made on 12/14.  Pt has no further questions at this time.

## 2021-10-17 NOTE — Telephone Encounter
Spoke to PT and advised of CT scheduled prior to [facility] on 12/14. Also Advised pt that her tx started on 12/7. Pt agreed to dates and times

## 2021-10-19 ENCOUNTER — Encounter: Admit: 2021-10-19 | Discharge: 2021-10-19 | Payer: BC Managed Care – PPO

## 2021-10-19 DIAGNOSIS — J45909 Unspecified asthma, uncomplicated: Secondary | ICD-10-CM

## 2021-10-19 DIAGNOSIS — K219 Gastro-esophageal reflux disease without esophagitis: Secondary | ICD-10-CM

## 2021-10-19 DIAGNOSIS — C50919 Malignant neoplasm of unspecified site of unspecified female breast: Secondary | ICD-10-CM

## 2021-10-19 DIAGNOSIS — D0511 Intraductal carcinoma in situ of right breast: Secondary | ICD-10-CM

## 2021-10-26 ENCOUNTER — Encounter: Admit: 2021-10-26 | Discharge: 2021-10-26 | Payer: BC Managed Care – PPO

## 2021-10-26 DIAGNOSIS — C50411 Malignant neoplasm of upper-outer quadrant of right female breast: Secondary | ICD-10-CM

## 2021-10-26 DIAGNOSIS — D0511 Intraductal carcinoma in situ of right breast: Secondary | ICD-10-CM

## 2021-10-26 DIAGNOSIS — J45909 Unspecified asthma, uncomplicated: Secondary | ICD-10-CM

## 2021-10-26 DIAGNOSIS — C50919 Malignant neoplasm of unspecified site of unspecified female breast: Secondary | ICD-10-CM

## 2021-10-26 DIAGNOSIS — K219 Gastro-esophageal reflux disease without esophagitis: Secondary | ICD-10-CM

## 2021-10-26 MED ORDER — PACLITAXEL 250ML IVPB
80 mg/m2 | Freq: Once | INTRAVENOUS | 0 refills | Status: CP
Start: 2021-10-26 — End: ?
  Administered 2021-10-26 (×2): 148.8 mg via INTRAVENOUS

## 2021-10-26 MED ORDER — DIPHENHYDRAMINE HCL 50 MG/ML IJ SOLN
50 mg | Freq: Once | INTRAVENOUS | 0 refills | Status: CP
Start: 2021-10-26 — End: ?
  Administered 2021-10-26: 19:00:00 50 mg via INTRAVENOUS

## 2021-10-26 MED ORDER — HEPARIN, PORCINE (PF) 100 UNIT/ML IV SYRG
500 [IU] | Freq: Once | 0 refills | Status: CP
Start: 2021-10-26 — End: ?

## 2021-10-26 MED ORDER — TRASTUZUMAB-ANNS IVPB
8 mg/kg | Freq: Once | INTRAVENOUS | 0 refills | Status: CP
Start: 2021-10-26 — End: ?
  Administered 2021-10-26 (×2): 636.1 mg via INTRAVENOUS

## 2021-10-26 MED ORDER — FAMOTIDINE (PF) 20 MG/2 ML IV SOLN
20 mg | Freq: Once | INTRAVENOUS | 0 refills | Status: CP
Start: 2021-10-26 — End: ?
  Administered 2021-10-26: 19:00:00 20 mg via INTRAVENOUS

## 2021-10-26 MED ORDER — DEXAMETHASONE SODIUM PHOS (PF) 10 MG/ML IJ SOLN
10 mg | Freq: Once | INTRAVENOUS | 0 refills | Status: CP
Start: 2021-10-26 — End: ?
  Administered 2021-10-26: 19:00:00 10 mg via INTRAVENOUS

## 2021-10-26 NOTE — Patient Instructions
Call Immediately to report the following:  Unexplained bleeding or bleeding that will not stop  Difficulty swallowing  Shortness of breath, wheezing, or trouble breathing  Rapid, irregular heartbeat; chest pain  Dizziness, lightheadedness  Rash or cut that swells or turns red, feels hot or painful, or begin to ooze  Diarrhea   Uncontrolled nausea or vomiting  Fever of 100.4 F or higher, or chills    Important Phone Numbers:  Cancer Center Main Number (answered 24 hours a day) [phone]  Cancer Center Scheduling (appointments) [phone]  Social Worker [phone]  Nutritionist [phone]

## 2021-10-26 NOTE — Progress Notes
Plan: second touch     Intervention: SW met with pt in tx for a second touch apt; pts spouse present for discussion. SW explained role; provided business card. Informed pt of [facility] [employer] and [employer]; provided info. Informed pt of DPOA services. Pt reports having a DPOA completed but hasn't yet provided that to [facility].   Pt reports a great support system. Pt is married and resides with her spouse. 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 Pt reports the possibility of having radiation in the future. Discussed [facility] services should pt feel lodging is needing if going through radiation. No SW needs identified at this time.     [place], LMSW

## 2021-10-26 NOTE — Progress Notes
Name: Kristy Martinez          MRN: [id number]      DOB: [birth year]      AGE: 60 y.o.   DATE OF SERVICE: 10/26/2021    Subjective:             Reason for Visit:  Follow Up      Kristy Martinez is a 60 y.o. female.     Cancer Staging  Ductal carcinoma in situ (DCIS) of right breast  Staging form: Breast, AJCC 8th Edition  - Clinical stage from 08/17/2021: Stage 0 (cTis (DCIS), cN0, cM0, ER+, PR+, HER2: Not Assessed) - Signed by Scarlett Presto, PA-C on 08/17/2021  - Pathologic stage from 09/05/2021: Stage IA (pT1b, pN0, cM0, G2, ER-, PR-, HER2+) - Signed by Scarlett Presto, PA-C on 09/21/2021      History of Present Illness    DIAGNOSIS: right breast cancer   PAST ONCOLOGY HISTORY: Kristy Martinez is a 60 year old post menopausal female who underwent a routine screening mammogram which revealed a focal asymmetry with associated microcalcifications in the right breast. ?Diagnostic mammogram showed a persistent asymmetry with associated fine pleomorphic calcifications in aggregate measuring approximately 2.3 cm. ?There was no ultrasound correlate, therefore stereotactic right breast biopsy was performed on 08/11/21 ([place]). 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 On 08/29/2021 Dr Cyril Mourning performed a right lumpectomy at [facility]. Pathology showed IDC, grade II, measuring 0.8cm, ER<1%, PR 0%, HER2 3+ IHC. On 09/19/2021 Dr Cyril Mourning performed a right ALNB at [facility]. Pathology showed 0 LN involved (0/3).     BREAST IMAGING:  Mammogram: ?  -- Bilateral screening mammogram 07/22/21 ([place]) revealed no suspicious abnormality is seen in the left breast. In the upper outer right breast, middle/posterior depth, there is a focal asymmetry with associated microcalcifications (MLO slice 30 and CC slice 38). Recommend right diagnostic mammogram and targeted ultrasound for further evaluation.   -- Right diagnostic mammogram 08/08/21 ([place]) revealed a persistent 1.5 cm focal asymmetry with subtle architectural distortion within the upper outer right breast middle-posterior depth at approximately 10:00. The focal asymmetry has associated fine pleomorphic calcifications in aggregate measuring approximately 2.0 x 1.5 x 2.3 cm in the AP, TV and CC dimensions.   Ultrasound: ?  -- Targeted right breast ultrasound 08/08/21 ([place]) revealed no convincing ultrasound correlate is identified for the mammographic focal asymmetry of concern. 5 morphologically normal appearing right axillary lymph nodes are documented. Impression: Suspicious focal asymmetry with associated calcifications in the upper outer right breast at 10:00. Stereotactic/tomosynthesis guided biopsy is recommended.     CXR 09/19/2021:    Left chest port with the tip overlying the mid SVC. Small nodular opacity overlying the right costophrenic angle. Repeat 2 view chest radiographs with nipple markers is recommended. If this persists on follow-up imaging, CT chest is recommended for better characterization.     Medical History:   Diagnosis Date   ? Asthma    ? Breast cancer (HCC)    ? Ductal carcinoma in situ (DCIS) of right breast 07/2021   ? GERD (gastroesophageal reflux disease)      Surgical History:   Procedure Laterality Date   ? TUBAL LIGATION  1983   ? Right Radioactive Seed Localized Lumpectomy Right 08/29/2021    Performed by Ignatius Specking, MD at [facility]   ? RADIOLOGICAL EXAM SURGICAL SPECIMEN Right 08/29/2021    Performed by Ignatius Specking, MD at [facility]   ? ADJACENT TRANSFER/  REARRANGEMENT 10.1 SQ CM TO 30.0 SQ CM - TORSO Right 08/29/2021    Performed by Ignatius Specking, MD at [facility]   ? IDENTIFICATION SENTINEL LYMPH NODE Right 09/19/2021    Performed by Ignatius Specking, MD at [facility]   ? INJECTION RADIOACTIVE TRACER FOR SENTINEL NODE IDENTIFICATION Right 09/19/2021    Performed by Ignatius Specking, MD at [facility]   ? Right Sentinel Lymph Node Biopsy Right 09/19/2021    Performed by Ign[facility]ius Specking, MD at [facility]   ? INSERTION TUNNELED CENTRAL VENOUS ACCESS DEVICE WITH SUBCUTANEOUS PORT - AGE 68 YEARS AND OVER  09/19/2021    Performed by Dellia Cloud, DO at [facility]   ? FLUOROSCOPIC GUIDANCE CENTRAL VENOUS ACCESS DEVICE PLACEMENT/ REPLACEMENT/ REMOVAL  09/19/2021    Performed by Dellia Cloud, DO at [facility]   ? TONSILLECTOMY      at age 8      Family History   Problem Relation Age of Onset   ? Arthritis-osteo Mother    ? Thyroid Disease Father    ? Cancer-Breast Paternal Aunt 37   ? Cancer-Breast Maternal Grandmother         41s   ? Cancer-Prostate Maternal Grandfather    ? Diabetes Paternal Grandmother      Social History     Socioeconomic History   ? Marital status: Married   Tobacco Use   ? Smoking status: Never   ? Smokeless tobacco: Never   Vaping Use   ? Vaping Use: Never used   Substance and Sexual Activity   ? Alcohol use: Yes     Alcohol/week: 1.0 standard drink     Types: 1 Glasses of wine per week     Comment: monthly   ? Drug use: Not Currently     Vaping/E-liquid Use   ? Vaping Use Never User               OB/GYN HISTORY: Age at onset of menstruation 6. G21P1. She had her first child at age 43. LMP 43; uterus and ovaries intact. She never took HRT.     PRESENT THERAPY: adjuvant Taxol every week x12/Herceptin every 3 weeks.     Kristy Martinez is here today for adjuvant Taxol/Herceptin cycle #1/12.       Review of Systems   Genitourinary:        Post menopausal   Uterus and ovaries intact        No Known Allergies    Objective:         ? acetaminophen (TYLENOL) 325 mg tablet Take two tablets by mouth every 6 hours as needed for Pain. Indications: take two tabs every 6 hours for the first three days after surgery, then take as needed   ? citalopram (CELEXA) 20 mg tablet Take 20 mg by mouth daily.   ? lidocaine/prilocaine (EMLA) 2.5/2.5 % topical cream Apply a nickel size of the cream to the center of the port, 30 minutes prior to port access. Cover the site with non adhesive bandage.   ? LORazepam (ATIVAN) 0.5 mg tablet Take one tablet by mouth every 8 hours as needed for Nausea, Vomiting or Other... (insomnia). Max Daily Amount: 1.5 mg.   ? omeprazole DR (PRILOSEC) 40 mg capsule Take 40 mg by mouth daily before breakfast.   ? prochlorperazine maleate (COMPAZINE) 10 mg tablet Take one tablet by mouth every 6 hours as needed.     Vitals:  10/26/21 0943 10/26/21 0944   BP:  136/88   BP Source:  Arm, Left Upper   Pulse:  87   Temp:  36.3 ?C (97.4 ?F)   Resp:  18   SpO2:  96%   O2 Device:  None (Room air)   TempSrc:  Temporal   PainSc: Zero Zero   Weight:  79.4 kg (175 lb)     Body mass index is 32.24 kg/m?Marland Kitchen     Pain Score: Zero       Fatigue Scale: 0-None    Pain Addressed:  N/A    Patient Evaluated for a Clinical Trial: No treatment clinical trial available for this patient.     Kristy Martinez Cooperative Oncology Group performance status is 0, Fully active, able to carry on all pre-disease performance without restriction.Marland Kitchen     Physical Exam  Vitals and nursing note reviewed. Exam conducted with a chaperone present.   Pulmonary:      Effort: No respiratory distress.   Chest:   Breasts:     Right: No inverted nipple, mass, nipple discharge, skin change or tenderness.      Left: No inverted nipple, mass, nipple discharge, skin change or tenderness.       Lymphadenopathy:      Cervical: No cervical adenopathy.      Upper Body:      Right upper body: No supraclavicular or axillary adenopathy.      Left upper body: No supraclavicular or axillary adenopathy.              LABS/RADIOLOGY:  CBC w diff    Lab Results   Component Value Date/Time    WBC 6.8 10/26/2021 09:10 AM    RBC 4.56 10/26/2021 09:10 AM    HGB 13.4 10/26/2021 09:10 AM    HCT 40.9 10/26/2021 09:10 AM    MCV 89.7 10/26/2021 09:10 AM    MCH 29.3 10/26/2021 09:10 AM    MCHC 32.7 10/26/2021 09:10 AM    RDW 14.2 10/26/2021 09:10 AM    PLTCT 307 10/26/2021 09:10 AM    MPV 7.6 10/26/2021 09:10 AM    Lab Results   Component Value Date/Time    NEUT 66 10/26/2021 09:10 AM    ANC 4.50 10/26/2021 09:10 AM    LYMA 17 (L) 10/26/2021 09:10 AM    ALC 1.10 10/26/2021 09:10 AM    MONA 10 10/26/2021 09:10 AM    AMC 0.60 10/26/2021 09:10 AM    EOSA 6 (H) 10/26/2021 09:10 AM    AEC 0.40 10/26/2021 09:10 AM    BASA 1 10/26/2021 09:10 AM    ABC 0.10 10/26/2021 09:10 AM        Comprehensive Metabolic Profile    Lab Results   Component Value Date/Time    NA 138 10/26/2021 09:10 AM    K 4.5 10/26/2021 09:10 AM    CL 102 10/26/2021 09:10 AM    CO2 30 10/26/2021 09:10 AM    GAP 6 10/26/2021 09:10 AM    BUN 16 10/26/2021 09:10 AM    CR 0.64 10/26/2021 09:10 AM    GLU 79 10/26/2021 09:10 AM    Lab Results   Component Value Date/Time    CA 9.1 10/26/2021 09:10 AM    ALBUMIN 4.1 10/26/2021 09:10 AM    TOTPROT 7.1 10/26/2021 09:10 AM    ALKPHOS 83 10/26/2021 09:10 AM    AST 13 10/26/2021 09:10 AM    ALT 13 10/26/2021 09:10 AM  TOTBILI 0.3 10/26/2021 09:10 AM          ECHO 10/05/2021: EF 65%    CXR 09/19/2021: Left chest port with the tip overlying the mid SVC. Small nodular opacity overlying the right costophrenic angle. Repeat 2 view chest radiographs with nipple markers is recommended. If this persists on follow-up imaging, CT chest is recommended for better characterization.     CXR 10/12/2021:Dense nodular opacity at the right lung base just lateral to the nipple marker probably represents a calcified granuloma. However, given recent diagnosis of breast cancer, consider further evaluation with chest CT.        Assessment and Plan:  1. Kristy Martinez is a 60 year old post menopausal female with right breast IDC, grade II, measuring 0.8cm, ER/PR negative and HER2 3+ positive. S/P right lumpectomy and SLNB with 0 LN involved. She was seen initially by Dr Arliss Journey.   2. We have reviewed the patient's radiology and pathology results in detail with her and her husband.  We have reviewed the particular features of her cancer including stage, grade, hormone receptor and HER2 neu status.   3. Due to her cancer being HER2 positive; we are recommending adjuvant chemotherapy with Taxol weekly x12 and Herceptin every 3 weeks for one year. She will receive cycle #1/12 taxol/Herceptin today.   4. Repeat ECHO every 3 months while on Herceptin. Due 12/2021.   5. CXR from 09/19/2021 showed small nodular opacity overlying the right costophrenic angle. Repeat 2 view chest radiographs with nipple markers showed dense nodular opacity at the right lung base just lateral to the nipple marker probably represents a calcified granuloma. We will schedule a CT chest.  Due 11/02/2021.   6. Bilateral mammogram to be followed by Dr Cyril Mourning.   7. She has seen Dr Isabella Stalling for breast radiation.    8. RTC in 3 weeks with labs and chemotherapy.     My collaborating MD Dr Raul Del.     For up-to-date information on COVID-19, visit the [facility] website. [email or web address]  ? General supportive care during cold and flu season and infection prevention reminders:   o Wash hands often with soap and water for at least 20 seconds or use hand sanitizer with at least 60% alcohol.   o Cover your mouth and nose.  o Practice social distancing: Try to maintain 6 feet between you and people outside your household.  o Avoid crowds and poorly ventilated spaces.  o Stay home if sick and symptoms are mild or manageable. If you must be around other people, wear a mask and practice social distancing.     ? If you are having symptoms of a lower respiratory infection (cough, shortness of breath) and/or fever or have been exposed to someone with COVID-19:    o Call your primary care provider for questions or health needs.   - Tell your doctor about your recent travel and your symptoms          In a medical emergency, call 911 or go to the nearest emergency room.      Elenore Paddy Quantrell Splitt, APRN-NP

## 2021-10-26 NOTE — Progress Notes
Clinical Nutrition Assessment Summary    Kristy Martinez is a 60 y.o. 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 S/P right lumpectomy and SLNB with 0 LN involved.  Current Treatment Plan: OP BREAST PACLITAXEL (WEEKLY) + TRASTUZUMAB 8/6    Nutrition Assessment of Patient:  BMI Categories Adult: Obesity Class I: 30-34.9  Desired Weight: 61 kg  Estimated Protein Needs: 80 (1.3 g/kg dw)  Needs to promote: weight maintainence    Wt Readings from Last 5 Encounters:   10/26/21 79.4 kg (175 lb)   10/12/21 79.5 kg (175 lb 3.2 oz)   10/05/21 80.3 kg (177 lb)   09/30/21 78.5 kg (173 lb)   09/19/21 77.7 kg (171 lb 6.4 oz)      RD met with patient today in tx to discuss the following:  ? Discussed nutritional/lifestyle guidelines for breast cancer, along with calorie and protein needs.  ? We discussed importance of preventing weight gain and working toward healthier weight if needed to decrease risk for comorbidities and cancer recurrence.  ? Encouraged small, frequent meals and healthful meals/snacks. Patient was provided ACS booklet with tips for managing side effects of chemo.   ? A Plant based diet was encouraged; recommending 1? - 2 cups of fruits and 2 - 3 cups of vegetables daily.  ? We discussed healthy diet in relation to weight control. I educated the patient on MyPlate eating plan (1/4 plate lean protein, 1/4 plate starches, 1/2 plate low starch veggies). We discussed appropriate portion sizes. Aim for at least 5 servings of fruits and vegetables per day. Also focus on whole grains to obtain 25-30g fiber per day. Include more lean protein sources such as fish, poultry, legumes verses red and processed meat. Patient was provided with handout. Provided patient with tips for increasing fruits/vegetables and healthy protein shake recipes to be used for healthy snacks or meal replacements.   ? Discussed per AICR recommendations limiting red meats to 12-18 oz or less per week and limiting processed meats as often as possible.  ? Recommended limiting saturated fats and added/processed sugars.  ? Physical activity was encouraged as strategy for improving fatigue, weight control, and healthy lifestyle behavior changes.   ? Discussed whole soy foods of 1-2 servings per day or less as appropriate (10-40 mg soy isoflavones).  ? I reinforced need for adequate hydration during chemotherapy, minimum of 2 liters (64 oz) daily.  ? Patient was provided with RD's contact information for further assistance if needed. Will be available PRN.    Bishop Dublin, MS, RD, CSO, LD  Clinical Oncology Nutrition Specialist  Phone: [phone]

## 2021-10-26 NOTE — Progress Notes
CHEMO NOTE  Verified chemo consent signed and in chart.    Blood return positive via: Port (Single and Accessed)    BSA and dose double checked (agree with orders as written) with: yes     Labs/applicable tests checked: CBC, Comprehensive Metabolic Panel (CMP) and Echocardiogram    Chemo regimen: Drug/cycle/day Herceptin, taxol IV C1 D1    Rate verified and armband double check with second RN: yes    Patient education offered and stated understanding. Denies questions at this time.    Chemo consent signed on 10/12/21.  Pt seen in exam by Blanchard Kelch, ARNP, no new questions or concerns at this time.  Oriented pt and pt's husband to the cancer center, reviewed today's chemotherapy and treatment routine.  Pt tolerated herceptin and taxol infusions without incident.  AVS given to pt and reviewed, left ambulatory at 1641.

## 2021-10-27 ENCOUNTER — Encounter: Admit: 2021-10-27 | Discharge: 2021-10-27 | Payer: BC Managed Care – PPO

## 2021-11-02 ENCOUNTER — Encounter: Admit: 2021-11-02 | Discharge: 2021-11-02 | Payer: BC Managed Care – PPO

## 2021-11-02 ENCOUNTER — Ambulatory Visit: Admit: 2021-11-02 | Discharge: 2021-11-02 | Payer: BC Managed Care – PPO

## 2021-11-02 DIAGNOSIS — C50411 Malignant neoplasm of upper-outer quadrant of right female breast: Secondary | ICD-10-CM

## 2021-11-02 DIAGNOSIS — C50911 Malignant neoplasm of unspecified site of right female breast: Secondary | ICD-10-CM

## 2021-11-02 DIAGNOSIS — R918 Other nonspecific abnormal finding of lung field: Secondary | ICD-10-CM

## 2021-11-02 MED ORDER — IOHEXOL 350 MG IODINE/ML IV SOLN
70 mL | Freq: Once | INTRAVENOUS | 0 refills | Status: CP
Start: 2021-11-02 — End: ?
  Administered 2021-11-02: 20:00:00 70 mL via INTRAVENOUS

## 2021-11-02 MED ORDER — FAMOTIDINE (PF) 20 MG/2 ML IV SOLN
20 mg | Freq: Once | INTRAVENOUS | 0 refills | Status: CP
Start: 2021-11-02 — End: ?
  Administered 2021-11-02: 22:00:00 20 mg via INTRAVENOUS

## 2021-11-02 MED ORDER — SODIUM CHLORIDE 0.9 % IJ SOLN
50 mL | Freq: Once | INTRAVENOUS | 0 refills | Status: CP
Start: 2021-11-02 — End: ?
  Administered 2021-11-02: 20:00:00 50 mL via INTRAVENOUS

## 2021-11-02 MED ORDER — PACLITAXEL 250ML IVPB
80 mg/m2 | Freq: Once | INTRAVENOUS | 0 refills | Status: CP
Start: 2021-11-02 — End: ?
  Administered 2021-11-02 (×2): 148.8 mg via INTRAVENOUS

## 2021-11-02 MED ORDER — DEXAMETHASONE SODIUM PHOS (PF) 10 MG/ML IJ SOLN
10 mg | Freq: Once | INTRAVENOUS | 0 refills | Status: CP
Start: 2021-11-02 — End: ?
  Administered 2021-11-02: 22:00:00 10 mg via INTRAVENOUS

## 2021-11-02 MED ORDER — DIPHENHYDRAMINE IVPB
50 mg | Freq: Once | INTRAVENOUS | 0 refills | Status: CP
Start: 2021-11-02 — End: ?
  Administered 2021-11-02 (×2): 50 mg via INTRAVENOUS

## 2021-11-02 MED ORDER — HEPARIN, PORCINE (PF) 100 UNIT/ML IV SYRG
500 [IU] | Freq: Once | 0 refills | Status: CP
Start: 2021-11-02 — End: ?

## 2021-11-02 NOTE — Progress Notes
CHEMO NOTE  Verified chemo consent signed and in chart.    Blood return positive via: Port (Single and Accessed)    BSA and dose double checked (agree with orders as written) with: yes See MAR for dual RN sign-off    Labs/applicable tests checked: CBC and Comprehensive Metabolic Panel (CMP)    Chemo regimen: Drug/cycle/day: [id number] Taxol    Rate verified and armband double check with second RN: yes    Patient education offered and stated understanding. Denies questions at this time.      Patient here for Taxol. Patient reports she is doing well and denies any new issues or concerns other than being sick over the weekend with congestion and fever. Dr Humphrey Rolls informed of illness but how patient returned to baseline since Monday. Dr Humphrey Rolls ok to continue with treatment today. Pt informed to call in the future with illness or fever >100.4. PAC accessed in lab; flushed well with positive blood return noted. Labs reviewed and within normal limits for treatment. Premeds given per [facility]. Patient tolerated Taxol without issue. PAC de-accessed per protocol. Patient left ambulatory with husband in good condition all questions and concerns addressed.

## 2021-11-02 NOTE — Patient Instructions
[facility]  Chemotherapy Instructions    Kristy Martinez 11/02/2021    Chemotherapy Drugs:  cycle 1 Day 8 Taxol      Call Immediately to report the following:  Unexplained bleeding or bleeding that will not stop  Difficulty swallowing  Shortness of breath, wheezing, or trouble breathing  Rapid, irregular heartbeat; chest pain  Dizziness, lightheadedness  Rash or cut that swells or turns red, feels hot or painful, or begin to ooze  Diarrhea   Uncontrolled nausea or vomiting  Fever of 100.4 F or higher, or chills    Important Phone Numbers:  Cancer Center Main Number (answered 24 hours a day) [phone]  Cancer Center Scheduling (appointments) [phone]  Social Worker [phone]  Nutritionist [phone]     FATIGUE  Fatigue is when a person has less energy to do the things he or she normally does, or wants to do. Fatigue is the most common side effect of cancer treatment. This fatigue is different from that of everyday life. Fatigue related to cancer treatment can appear suddenly and can be overwhelming. It is not relieved by rest. It can last for months after treatment ends. This type of fatigue can affect many aspects of a person?s life, including the ability to do his usual activities.   Cancer fatigue is real and should not be ignored. It can be worse when a person is dehydrated, anemic, in pain, not sleeping well, or has an infection. Recent studies have shown that exercise programs during treatment can help reduce fatigue.   What to Look For   Feeling like you have no energy   Sleeping more than normal   Not wanting to or not being able to do normal activities   Less attention to personal appearance   Feeling tired even after sleeping   Trouble thinking or concentrating   Trouble finding words and speaking   What the Patient Can Do   Balance rest and activities.   Tell the doctor if you?re not able to get around as well as usual.   Plan your important activities for when you have the most energy.   Schedule important activities throughout the day rather than all at once.   Get enough rest and sleep. Short naps and rest breaks may be needed.   Remember that fatigue caused by treatment is short-term and that energy will slowly get better after treatment has ended.   Ask others to help you by cooking meals and doing housework, yard work, and errands.   Eat a balanced diet that includes protein (meat, eggs, cheese, and legumes such as peas and beans) and drink about 8 to 10 glasses of water a day, unless your care team gives you other instructions.   What Caregivers Can Do   Help schedule friends and family members to prepare meals, clean house, do yard work, or run errands for the patient.   Try not to push the patient to do more than he or she is able.   Help the patient to set up a routine for activities during the day.   Call the doctor if the patient:   is too tired to get out of bed for more than a 24-hour period   becomes confused   has fatigue that keeps getting worse   feels out of breath or has a racing heart after just a little activity  [employer] www.cancer.org     INFECTION/FEVER  Fever is a body temperature of more than 100.5?F taken by mouth that lasts for 1 or more days. Fever is usually caused by an infection. Infections can be viral (in which case the symptoms can be treated even though there may be no treatment for the cause), or they can be bacterial or fungal (in which case medicines may be prescribed after the infection is diagnosed). Other causes include inflammatory illness, drug reactions, or tumor growth. Sometimes, the cause may not be known. In an infection, the fever is a result of the body heating up to try to kill any invading germs. A fever is an important natural defense against germs.   People getting chemo are more likely to have infections because they have lower numbers of the white blood cells needed to fight them. It is good to have an easy-to-read, easy-to-use, oral (by mouth) thermometer so you can check your body temperature.   What to look for   Increased skin temperature   Feeling warm   Feeling tired   Headache   Feeling cold   Body aches   Skin rashes   Shaking chills   Any new area of redness or swelling   Pus or yellowish discharge from an injury or other location   New cough or shortness of breath   New abdominal pain   Burning or pain when urinating   Sore throat   The patient is confused, doesn?t know where he or she is, becomes forgetful, or isn?t making sense (see section on confusion)   What the patient can do   If you start feeling warm or cold, check your temperature by mouth every 2 to 3 hours. If unable to hold the thermometer in your mouth, put it under your armpit.   Keep a record of temperature readings.   Drink a lot of liquids (water, fruit juices, cola, popsicles, and soups).   Get enough rest.   Cover yourself with a blanket if chilly.   Cover yourself only with a sheet if hot.   Use a cold compress on the forehead if hot.   Take acetaminophen (Tylenol?) or other medicines for fever only if your doctor tells you to do so.   What caregivers can do   Watch for shaking chills, and check the patient's temperature after the shaking stops.   Check temperature by placing the thermometer in mouth or under armpit. (Do not take temperature rectally unless the doctor tells you it's okay.)   Encourage visitors who have fevers or the flu to visit the patient by phone until they are well again.   Offer extra fluids and snacks.   Help the patient take medicines on schedule.   Call the doctor if the patient if the patient is confused, doesn?t know where he or she is, becomes forgetful, or isn?t making sense.  Call the doctor if the patient:   has a temperature of 100.5?F or higher, taken by mouth   has 2 or more of the symptoms listed under ?What to look for?   has fever lasting for more than 24 hours   has shaking chills   cannot take fluids  [employer] www.cancer.org     HAIR LOSS  The normal scalp contains about 100,000 hairs. They are constantly growing, with old hairs falling out and being replaced by new ones. Some cancer treatments will cause people to lose some or all of their hair, most often in clumps during shampooing or brushing. Sometimes  clumps of hair are found on the pillow in the morning.   It is normal for men and women to feel upset about hair loss. It helps to understand why it happens, to know that hair will grow back, and to take steps to make it less of problem for you.   Hair loss can happen when chemotherapy drugs travel throughout the body to kill cancer cells. Some of these drugs damage hair follicles, causing the hair to fall out. Hair loss can be hard to predict. Some patients have it, and others do not, even when they take the same drugs. Some drugs can cause hair loss on the scalp and other places on the body, including pubic hair, arm and leg hair, eyebrows, and eyelashes. Some drugs can cause only the loss of head hair. Radiation therapy to the head often causes scalp hair loss. Sometimes, depending on the dose of radiation to the head, the hair does not grow back the same as it was before.   If hair loss does occur, it most often begins within 2 weeks of the start of treatment and gets worse 1 to 2 months after starting therapy. Your scalp may feel very sensitive to washing, combing, or brushing during the short time when your hair is actually falling out. Hair often starts to grow back even before therapy is completed.   What the patient can do   If you think you might want a wig, buy it before treatment begins or at the very start of treatment. Ask if the wig can be adjusted--your wig size can shrink as you lose hair.   If you buy a wig before hair loss begins, the wig shop can better match your hair color and texture. Or you can cut a swatch of hair from the top front of your head, where hair is lightest, to use for matching.   Be sure to get a prescription from your doctor for the wig because it is often covered by insurance.   Get a list of wig shops in your area from your doctor or nurse, other patients, or from the phone book. You can also order a tlc? catalog (for women with hair loss due to cancer treatment) by calling [phone], or visit their Web site at http://www.wall.info/.   If you are going to buy a wig, try on different ones until you find one that you really like. Consider buying 2 wigs, one for everyday and one for special occasions.   Synthetic wigs need less styling than human hair wigs. They may be easier to care for if you have low energy during cancer treatment.   Some people find wigs to be hot or itchy. In that case, turbans or scarves can be used instead of wigs. Cotton items tend to stay on your smooth scalp better than nylon or polyester.   Be gentle when brushing and washing your hair.   Wear a hat or scarf outdoors in cold weather to reduce the loss of body heat.   Use sunscreen, sunblock, or a hat to protect your scalp from the sun.   Hair loss can be somewhat reduced by avoiding too much brushing or pulling of hair, and by avoiding heat (such as electric rollers, hair dryers, and curling irons).   Wear a hair net at night, or sleep on a satin pillowcase to keep hair from coming out in clumps.   Avoid styles that pull on the hair, such as braids or ponytails.  Use a wide-toothed comb.   Be gentle with eyelashes and eyebrows, which are sometimes affected too.   If you are bothered by hair falling out, you may choose to cut your hair very short or even shave your head.   When new hair starts to grow, it may break easily at first. Avoid perms for the first few months. Keep hair short and easy to style.    [employer] www.cancer.org

## 2021-11-03 ENCOUNTER — Encounter: Admit: 2021-11-03 | Discharge: 2021-11-03 | Payer: BC Managed Care – PPO

## 2021-11-04 ENCOUNTER — Encounter: Admit: 2021-11-04 | Discharge: 2021-11-04 | Payer: BC Managed Care – PPO

## 2021-11-04 DIAGNOSIS — C50911 Malignant neoplasm of unspecified site of right female breast: Secondary | ICD-10-CM

## 2021-11-04 DIAGNOSIS — E041 Nontoxic single thyroid nodule: Secondary | ICD-10-CM

## 2021-11-04 DIAGNOSIS — R918 Other nonspecific abnormal finding of lung field: Secondary | ICD-10-CM

## 2021-11-04 NOTE — Progress Notes
Notified patient of her CT chest and recommendation from provider to repeat Ct chest in 6 mons for nodules follow up and [place] as soon as able.   Patient asked to have her [place] appointment coordinate when she is here next time for treatment since she has a long commute to [place] location. Message to [facility] to assist with her  schedule.

## 2021-11-07 ENCOUNTER — Encounter: Admit: 2021-11-07 | Discharge: 2021-11-07 | Payer: BC Managed Care – PPO

## 2021-11-07 IMAGING — CR CHEST
2 series · 2 of 2 positions shown · non-contrast
Comparison: none

[chest pa x-wise]
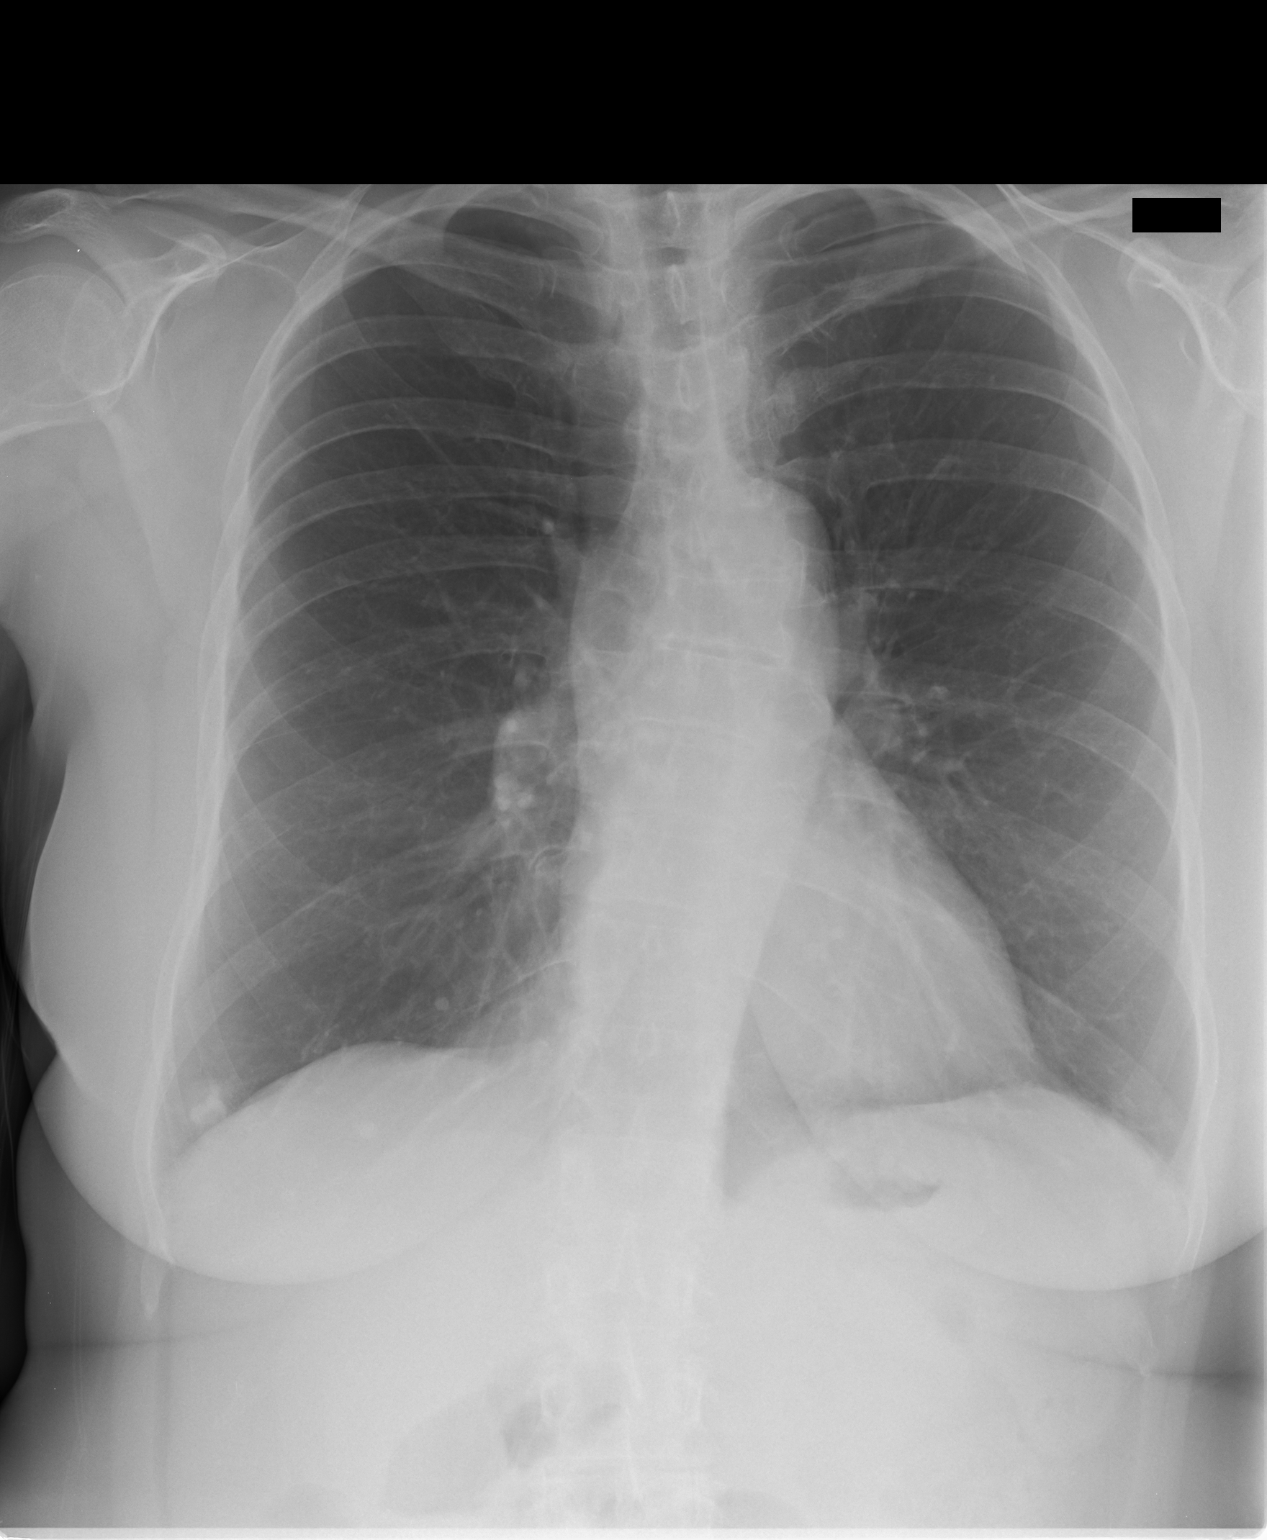

[chest lat]
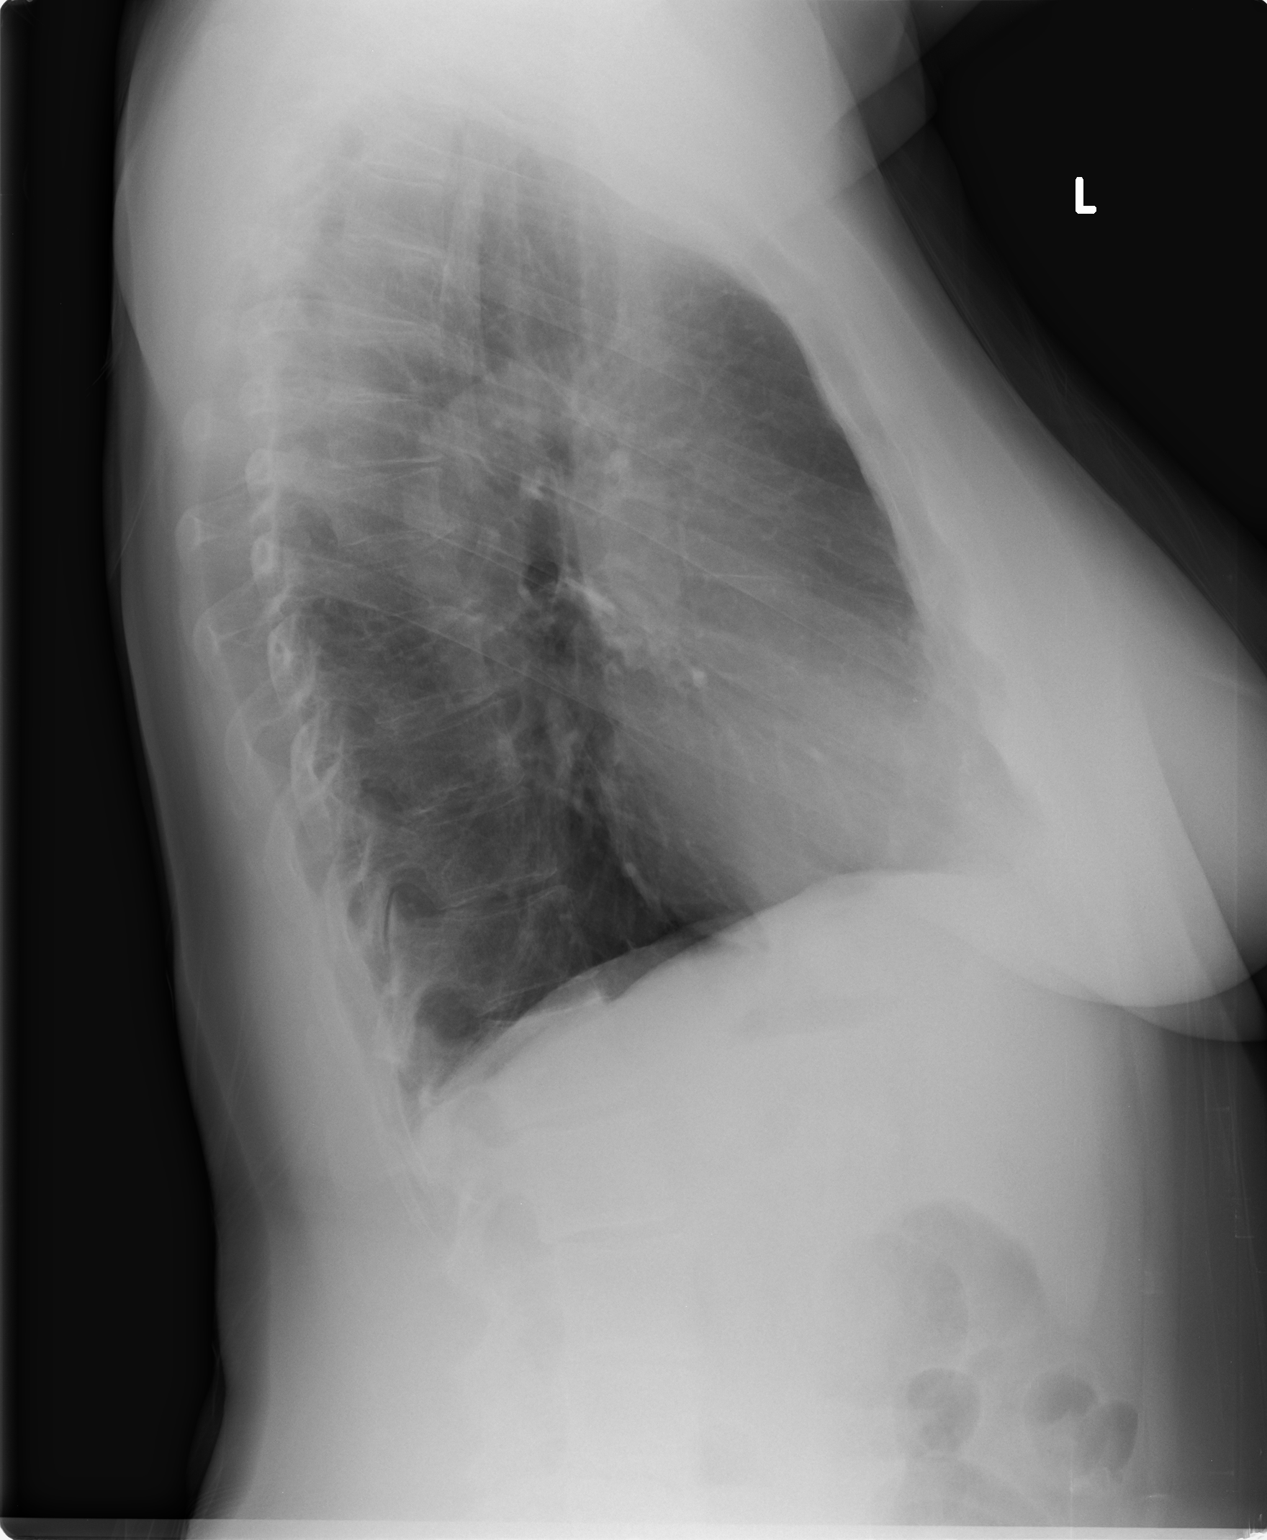

[2 of 2 positions shown; findings below may reference images not displayed]

EXAM

XR chest 2V

INDICATION

Shortness of breath

TECHNIQUE

PA and Lateral views of the chest

COMPARISONS

01/16/18

FINDINGS

No radiographically apparent pleural effusion, consolidation, or pneumothorax. Calcified granuloma
in the right lung base. Multiple additional calcified granulomas in the right lung base.

No definitive peribronchial cuffing on the provided exam.

The cardiomediastinal silhouette is normal in size.

The osseous structures are without an acute osseous abnormality.

IMPRESSION
1. No radiographic evidence of an acute cardiopulmonary process.

Tech Notes:

EVALUATION FOR ASTHMA. HB/TB/BM

## 2021-11-08 ENCOUNTER — Encounter: Admit: 2021-11-08 | Discharge: 2021-11-08 | Payer: BC Managed Care – PPO

## 2021-11-09 ENCOUNTER — Encounter: Admit: 2021-11-09 | Discharge: 2021-11-09 | Payer: BC Managed Care – PPO

## 2021-11-09 DIAGNOSIS — C50411 Malignant neoplasm of upper-outer quadrant of right female breast: Secondary | ICD-10-CM

## 2021-11-09 MED ORDER — PACLITAXEL 250ML IVPB
80 mg/m2 | Freq: Once | INTRAVENOUS | 0 refills | Status: CP
Start: 2021-11-09 — End: ?
  Administered 2021-11-09 (×2): 148.8 mg via INTRAVENOUS

## 2021-11-09 MED ORDER — DIPHENHYDRAMINE IVPB
50 mg | Freq: Once | INTRAVENOUS | 0 refills | Status: CP
Start: 2021-11-09 — End: ?
  Administered 2021-11-09 (×2): 50 mg via INTRAVENOUS

## 2021-11-09 MED ORDER — DEXAMETHASONE SODIUM PHOS (PF) 10 MG/ML IJ SOLN
10 mg | Freq: Once | INTRAVENOUS | 0 refills | Status: CP
Start: 2021-11-09 — End: ?
  Administered 2021-11-09: 22:00:00 10 mg via INTRAVENOUS

## 2021-11-09 MED ORDER — FAMOTIDINE (PF) 20 MG/2 ML IV SOLN
20 mg | Freq: Once | INTRAVENOUS | 0 refills | Status: CP
Start: 2021-11-09 — End: ?
  Administered 2021-11-09: 22:00:00 20 mg via INTRAVENOUS

## 2021-11-09 NOTE — Progress Notes
CHEMO NOTE  Verified chemo consent signed and in chart.    Blood return positive via: Port (Single and Power Port)    BSA and dose double checked (agree with orders as written) with: yes see MAR    Labs/applicable tests checked: CBC and Comprehensive Metabolic Panel (CMP)    Chemo regimen: Drug/cycle/day: C1D15   PACLitaxeL (TAXOL) 148.8 mg in sodium chloride 0.9% (non-DEHP/PVC) 274.8 mL IVPB     Rate verified and armband double check with second RN: yes    Patient education offered and stated understanding. Denies questions at this time.      Patient arrived to CC treatment for C1D15 of Taxol. Patient has no question, concerns or complaints at this time. 2 Pt received treatment as ordered without incidences. Pt's PAC was flushed and deaccessed per protocol. Pt left ambulatory with no further questions.

## 2021-11-16 ENCOUNTER — Encounter: Admit: 2021-11-16 | Discharge: 2021-11-16 | Payer: BC Managed Care – PPO

## 2021-11-16 DIAGNOSIS — C50919 Malignant neoplasm of unspecified site of unspecified female breast: Secondary | ICD-10-CM

## 2021-11-16 DIAGNOSIS — C50411 Malignant neoplasm of upper-outer quadrant of right female breast: Secondary | ICD-10-CM

## 2021-11-16 DIAGNOSIS — J45909 Unspecified asthma, uncomplicated: Secondary | ICD-10-CM

## 2021-11-16 DIAGNOSIS — K219 Gastro-esophageal reflux disease without esophagitis: Secondary | ICD-10-CM

## 2021-11-16 DIAGNOSIS — D0511 Intraductal carcinoma in situ of right breast: Secondary | ICD-10-CM

## 2021-11-16 MED ORDER — TRASTUZUMAB-ANNS IVPB
6 mg/kg | Freq: Once | INTRAVENOUS | 0 refills | Status: CP
Start: 2021-11-16 — End: ?
  Administered 2021-11-16 (×2): 476.9 mg via INTRAVENOUS

## 2021-11-16 MED ORDER — FAMOTIDINE (PF) 20 MG/2 ML IV SOLN
20 mg | Freq: Once | INTRAVENOUS | 0 refills | Status: CP
Start: 2021-11-16 — End: ?
  Administered 2021-11-16: 19:00:00 20 mg via INTRAVENOUS

## 2021-11-16 MED ORDER — DIPHENHYDRAMINE IVPB
50 mg | Freq: Once | INTRAVENOUS | 0 refills | Status: CP
Start: 2021-11-16 — End: ?
  Administered 2021-11-16 (×2): 50 mg via INTRAVENOUS

## 2021-11-16 MED ORDER — DEXAMETHASONE SODIUM PHOS (PF) 10 MG/ML IJ SOLN
10 mg | Freq: Once | INTRAVENOUS | 0 refills | Status: CP
Start: 2021-11-16 — End: ?
  Administered 2021-11-16: 19:00:00 10 mg via INTRAVENOUS

## 2021-11-16 MED ORDER — PACLITAXEL 250ML IVPB
80 mg/m2 | Freq: Once | INTRAVENOUS | 0 refills | Status: CP
Start: 2021-11-16 — End: ?
  Administered 2021-11-16 (×2): 148.8 mg via INTRAVENOUS

## 2021-11-16 MED ORDER — HEPARIN, PORCINE (PF) 100 UNIT/ML IV SYRG
500 [IU] | Freq: Once | 0 refills | Status: CP
Start: 2021-11-16 — End: ?

## 2021-11-16 NOTE — Progress Notes
Name: Kristy Martinez          MRN: [id number]      DOB: 1961/03/15      AGE: 60 y.o.   DATE OF SERVICE: 11/16/2021    Subjective:             Reason for Visit:  Follow Up      Kristy Martinez is a 60 y.o. female.     Cancer Staging  Ductal carcinoma in situ (DCIS) of right breast  Staging form: Breast, AJCC 8th Edition  - Clinical stage from 08/17/2021: Stage 0 (cTis (DCIS), cN0, cM0, ER+, PR+, HER2: Not Assessed) - Signed by Scarlett Presto, PA-C on 08/17/2021  - Pathologic stage from 09/05/2021: Stage IA (pT1b, pN0, cM0, G2, ER-, PR-, HER2+) - Signed by Scarlett Presto, PA-C on 09/21/2021      History of Present Illness    DIAGNOSIS: right breast cancer   PAST ONCOLOGY HISTORY: Kristy Martinez is a 60 year old post menopausal female who underwent a routine screening mammogram which revealed a focal asymmetry with associated microcalcifications in the right breast. ?Diagnostic mammogram showed a persistent asymmetry with associated fine pleomorphic calcifications in aggregate measuring approximately 2.3 cm. ?There was no ultrasound correlate, therefore stereotactic right breast biopsy was performed on 08/11/21 ([place]). 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 On 08/29/2021 Dr Cyril Mourning performed a right lumpectomy at [facility]. Pathology showed IDC, grade II, measuring 0.8cm, ER<1%, PR 0%, HER2 3+ IHC. On 09/19/2021 Dr Cyril Mourning performed a right ALNB at [facility]. Pathology showed 0 LN involved (0/3).     BREAST IMAGING:  Mammogram: ?  -- Bilateral screening mammogram 07/22/21 ([place]) revealed no suspicious abnormality is seen in the left breast. In the upper outer right breast, middle/posterior depth, there is a focal asymmetry with associated microcalcifications (MLO slice 30 and CC slice 38). Recommend right diagnostic mammogram and targeted ultrasound for further evaluation.   -- Right diagnostic mammogram 08/08/21 ([place]) revealed a persistent 1.5 cm focal asymmetry with subtle architectural distortion within the upper outer right breast middle-posterior depth at approximately 10:00. The focal asymmetry has associated fine pleomorphic calcifications in aggregate measuring approximately 2.0 x 1.5 x 2.3 cm in the AP, TV and CC dimensions.   Ultrasound: ?  -- Targeted right breast ultrasound 08/08/21 ([place]) revealed no convincing ultrasound correlate is identified for the mammographic focal asymmetry of concern. 5 morphologically normal appearing right axillary lymph nodes are documented. Impression: Suspicious focal asymmetry with associated calcifications in the upper outer right breast at 10:00. Stereotactic/tomosynthesis guided biopsy is recommended.     CXR 09/19/2021:    Left chest port with the tip overlying the mid SVC. Small nodular opacity overlying the right costophrenic angle. Repeat 2 view chest radiographs with nipple markers is recommended. If this persists on follow-up imaging, CT chest is recommended for better characterization.     Medical History:   Diagnosis Date   ? Asthma    ? Breast cancer (HCC)    ? Ductal carcinoma in situ (DCIS) of right breast 07/2021   ? GERD (gastroesophageal reflux disease)      Surgical History:   Procedure Laterality Date   ? TUBAL LIGATION  1983   ? Right Radioactive Seed Localized Lumpectomy Right 08/29/2021    Performed by Ignatius Specking, MD at [facility]   ? RADIOLOGICAL EXAM SURGICAL SPECIMEN Right 08/29/2021    Performed by Ignatius Specking, MD at [facility]   ? ADJACENT TRANSFER/  REARRANGEMENT 10.1 SQ CM TO 30.0 SQ CM - TORSO Right 08/29/2021    Performed by Ignatius Specking, MD at [facility]   ? IDENTIFICATION SENTINEL LYMPH NODE Right 09/19/2021    Performed by Ignatius Specking, MD at [facility]   ? INJECTION RADIOACTIVE TRACER FOR SENTINEL NODE IDENTIFICATION Right 09/19/2021    Performed by Ignatius Specking, MD at [facility]   ? Right Sentinel Lymph Node Biopsy Right 09/19/2021    Performed by Ignatius Specking, MD at [facility] OR   ? INSERTION TUNNELED CENTRAL VENOUS ACCESS DEVICE WITH SUBCUTANEOUS PORT - AGE 30 YEARS AND OVER  09/19/2021    Performed by Dellia Cloud, DO at [facility]   ? FLUOROSCOPIC GUIDANCE CENTRAL VENOUS ACCESS DEVICE PLACEMENT/ REPLACEMENT/ REMOVAL  09/19/2021    Performed by Dellia Cloud, DO at [facility]   ? TONSILLECTOMY      at age 72      Family History   Problem Relation Age of Onset   ? Arthritis-osteo Mother    ? Thyroid Disease Father    ? Cancer-Breast Paternal Aunt 43   ? Cancer-Breast Maternal Grandmother         35s   ? Cancer-Prostate Maternal Grandfather    ? Diabetes Paternal Grandmother      Social History     Socioeconomic History   ? Marital status: Married   Tobacco Use   ? Smoking status: Never   ? Smokeless tobacco: Never   Vaping Use   ? Vaping Use: Never used   Substance and Sexual Activity   ? Alcohol use: Yes     Alcohol/week: 1.0 standard drink     Types: 1 Glasses of wine per week     Comment: monthly   ? Drug use: Not Currently     Vaping/E-liquid Use   ? Vaping Use Never User               OB/GYN HISTORY: Age at onset of menstruation 61. G21P1. She had her first child at age 41. LMP 43; uterus and ovaries intact. She never took HRT.     PRESENT THERAPY: adjuvant Taxol every week x12/Herceptin every 3 weeks.     Kristy Martinez is here today for adjuvant Taxol/Herceptin cycle #2/12.       Review of Systems   HENT: Positive for sinus pressure and voice change.    Genitourinary:        Post menopausal   Uterus and ovaries intact        No Known Allergies    Objective:         ? acetaminophen (TYLENOL) 325 mg tablet Take two tablets by mouth every 6 hours as needed for Pain. Indications: take two tabs every 6 hours for the first three days after surgery, then take as needed   ? citalopram (CELEXA) 20 mg tablet Take 20 mg by mouth daily.   ? fluticasone propionate (FLOVENT HFA) 110 mcg/actuation inhaler    ? lidocaine/prilocaine (EMLA) 2.5/2.5 % topical cream Apply a nickel size of the cream to the center of the port, 30 minutes prior to port access. Cover the site with non adhesive bandage.   ? LORazepam (ATIVAN) 0.5 mg tablet Take one tablet by mouth every 8 hours as needed for Nausea, Vomiting or Other... (insomnia). Max Daily Amount: 1.5 mg.   ? montelukast (SINGULAIR) 10 mg tablet    ? omeprazole DR (PRILOSEC) 40 mg capsule Take 40  mg by mouth daily before breakfast.   ? prochlorperazine maleate (COMPAZINE) 10 mg tablet Take one tablet by mouth every 6 hours as needed.     Vitals:    11/16/21 1059   BP: 138/83   BP Source: Arm, Left Upper   Pulse: 84   Temp: 36.7 ?C (98 ?F)   Resp: 16   SpO2: 97%   TempSrc: Oral   PainSc: Zero   Weight: 79.2 kg (174 lb 9.6 oz)     Body mass index is 31.37 kg/m?Marland Kitchen     Pain Score: Zero       Fatigue Scale: 0-None    Pain Addressed:  N/A    Patient Evaluated for a Clinical Trial: No treatment clinical trial available for this patient.     [place] Cooperative Oncology Group performance status is 0, Fully active, able to carry on all pre-disease performance without restriction.Marland Kitchen     Physical Exam  Vitals and nursing note reviewed. Exam conducted with a chaperone present.   Pulmonary:      Effort: No respiratory distress.   Chest:   Breasts:     Right: No inverted nipple, mass, nipple discharge, skin change or tenderness.      Left: No inverted nipple, mass, nipple discharge, skin change or tenderness.       Lymphadenopathy:      Cervical: No cervical adenopathy.      Upper Body:      Right upper body: No supraclavicular or axillary adenopathy.      Left upper body: No supraclavicular or axillary adenopathy.              LABS/RADIOLOGY:  CBC w diff    Lab Results   Component Value Date/Time    WBC 5.1 11/16/2021 10:23 AM    RBC 3.84 (L) 11/16/2021 10:23 AM    HGB 11.7 (L) 11/16/2021 10:23 AM    HCT 34.6 (L) 11/16/2021 10:23 AM    MCV 90.1 11/16/2021 10:23 AM    MCH 30.4 11/16/2021 10:23 AM    MCHC 33.7 11/16/2021 10:23 AM    RDW 14.5 11/16/2021 10:23 AM    PLTCT 431 (H) 11/16/2021 10:23 AM    MPV 7.2 11/16/2021 10:23 AM    Lab Results   Component Value Date/Time NEUT 62 11/16/2021 10:23 AM    ANC 3.20 11/16/2021 10:23 AM    LYMA 25 11/16/2021 10:23 AM    ALC 1.30 11/16/2021 10:23 AM    MONA 8 11/16/2021 10:23 AM    AMC 0.40 11/16/2021 10:23 AM    EOSA 3 11/16/2021 10:23 AM    AEC 0.20 11/16/2021 10:23 AM    BASA 2 11/16/2021 10:23 AM    ABC 0.10 11/16/2021 10:23 AM        Comprehensive Metabolic Profile    Lab Results   Component Value Date/Time    NA 138 11/16/2021 10:23 AM    K 4.0 11/16/2021 10:23 AM    CL 103 11/16/2021 10:23 AM    CO2 30 11/16/2021 10:23 AM    GAP 5 11/16/2021 10:23 AM    BUN 18 11/16/2021 10:23 AM    CR 0.64 11/16/2021 10:23 AM    GLU 103 (H) 11/16/2021 10:23 AM    Lab Results   Component Value Date/Time    CA 9.0 11/16/2021 10:23 AM    ALBUMIN 3.9 11/16/2021 10:23 AM    TOTPROT 6.8 11/16/2021 10:23 AM    ALKPHOS 72 11/16/2021 10:23 AM  AST 14 11/16/2021 10:23 AM    ALT 16 11/16/2021 10:23 AM    TOTBILI 0.3 11/16/2021 10:23 AM          ECHO 10/05/2021: EF 65%    CXR 09/19/2021: Left chest port with the tip overlying the mid SVC. Small nodular opacity overlying the right costophrenic angle. Repeat 2 view chest radiographs with nipple markers is recommended. If this persists on follow-up imaging, CT chest is recommended for better characterization.     CXR 10/12/2021:Dense nodular opacity at the right lung base just lateral to the nipple marker probably represents a calcified granuloma. However, given recent diagnosis of breast cancer, consider further evaluation with chest CT.     CT chest 11/02/2021:  1. Calcified granuloma within the right lower lobe, corresponding to the nodular opacity seen on recent chest radiograph.   2. Several scattered tiny nonspecific pulmonary nodules. Follow-up CT chest in 6 months is recommended for reevaluation.   3. 1.6 cm left thyroid nodule. Dedicated thyroid ultrasound is recommended for further evaluation. ?        Assessment and Plan:  1. Kristy Martinez is a 60 year old post menopausal female with right breast IDC, grade II, measuring 0.8cm, ER/PR negative and HER2 3+ positive. S/P right lumpectomy and SLNB with 0 LN involved. She was seen initially by Dr Arliss Journey.   2. We have reviewed the patient's radiology and pathology results in detail with her and her husband.  We have reviewed the particular features of her cancer including stage, grade, hormone receptor and HER2 neu status.   3. Due to her cancer being HER2 positive; we are recommending adjuvant chemotherapy with Taxol weekly x12 and Herceptin every 3 weeks for one year. She will receive cycle #2/12 taxol/Herceptin today.   4. Repeat ECHO every 3 months while on Herceptin. Due 12/2021.   5. CXR from 09/19/2021 showed small nodular opacity overlying the right costophrenic angle. Repeat 2 view chest radiographs with nipple markers showed dense nodular opacity at the right lung base just lateral to the nipple marker probably represents a calcified granuloma. We will schedule a CT chest.  CT chest from 11/02/2021 showed calcified granuloma within the right lower lobe, corresponding to the nodular opacity seen on recent chest radiograph. Several scattered tiny nonspecific pulmonary nodules. Follow-up CT chest in 6 months is recommended for reevaluation; scheduled 04/2022.   6. Repeat thyroid ultrasound due to CT chest 11/02/2021 showed 1.6 cm left thyroid nodule. Dedicated thyroid ultrasound is recommended for further evaluation. ?Due 04/2022.   7. Bilateral mammogram to be followed by Dr Cyril Mourning. Due 06/2022.   8. She has seen Dr Isabella Stalling for breast radiation.    9. RTC in 3 weeks with labs and chemotherapy.     My collaborating MD Dr Raul Del.     For up-to-date information on COVID-19, visit the [facility] website. [email or web address]  ? General supportive care during cold and flu season and infection prevention reminders:   o Wash hands often with soap and water for at least 20 seconds or use hand sanitizer with at least 60% alcohol.   o Cover your mouth and nose.  o Practice social distancing: Try to maintain 6 feet between you and people outside your household.  o Avoid crowds and poorly ventilated spaces.  o Stay home if sick and symptoms are mild or manageable. If you must be around other people, wear a mask and practice social distancing.     ? If you are having symptoms  of a lower respiratory infection (cough, shortness of breath) and/or fever or have been exposed to someone with COVID-19:    o Call your primary care provider for questions or health needs.   - Tell your doctor about your recent travel and your symptoms          In a medical emergency, call 911 or go to the nearest emergency room.      Elenore Paddy Greco Gastelum, APRN-NP

## 2021-11-16 NOTE — Progress Notes
CHEMO NOTE  Verified chemo consent signed and in chart.    Blood return positive via: Port (Single)    BSA and dose double checked (agree with orders as written) with: yes     Labs/applicable tests checked: CBC and Comprehensive Metabolic Panel (CMP)    Chemo regimen: Drug/cycle/day    trastuzumab-anns Calla Kicks) 476.9 mg in sodium chloride 0.9% (NS) 272.7095 mL IVPB    PACLitaxeL (TAXOL) 148.8 mg in sodium chloride 0.9% (non-DEHP/PVC) 274.8 mL IVPB      Rate verified and armband double check with second RN: yes    Patient education offered and stated understanding. Denies questions at this time.    Patient arrived to [place] treatment for C2D1 Kanjinti/Taxol . Pt seen in clinic today by Delsa Bern. Pt reports all needs addressed by provider, denies new concerns at this time. PAC accessed in lab, flushes, with positive blood return noted. Parameters met for treatment. Premedications and chemotherapy initiated per treatment plan. Treatment completed, pt tolerated w/o adverse event. PAC flushed and de-accessed.  Pt left treatment w/o further questions or concerns.

## 2021-11-23 ENCOUNTER — Encounter: Admit: 2021-11-23 | Discharge: 2021-11-23 | Payer: BC Managed Care – PPO

## 2021-11-23 DIAGNOSIS — C50919 Malignant neoplasm of unspecified site of unspecified female breast: Secondary | ICD-10-CM

## 2021-11-23 DIAGNOSIS — J45909 Unspecified asthma, uncomplicated: Secondary | ICD-10-CM

## 2021-11-23 DIAGNOSIS — D0511 Intraductal carcinoma in situ of right breast: Secondary | ICD-10-CM

## 2021-11-23 DIAGNOSIS — K219 Gastro-esophageal reflux disease without esophagitis: Secondary | ICD-10-CM

## 2021-11-23 DIAGNOSIS — C50411 Malignant neoplasm of upper-outer quadrant of right female breast: Secondary | ICD-10-CM

## 2021-11-23 MED ORDER — HEPARIN, PORCINE (PF) 100 UNIT/ML IV SYRG
500 [IU] | Freq: Once | 0 refills | Status: CP
Start: 2021-11-23 — End: ?

## 2021-11-23 MED ORDER — PACLITAXEL 250ML IVPB
80 mg/m2 | Freq: Once | INTRAVENOUS | 0 refills | Status: CP
Start: 2021-11-23 — End: ?
  Administered 2021-11-23 (×2): 148.8 mg via INTRAVENOUS

## 2021-11-23 MED ORDER — FAMOTIDINE (PF) 20 MG/2 ML IV SOLN
20 mg | Freq: Once | INTRAVENOUS | 0 refills | Status: CP
Start: 2021-11-23 — End: ?
  Administered 2021-11-23: 20:00:00 20 mg via INTRAVENOUS

## 2021-11-23 MED ORDER — DIPHENHYDRAMINE IVPB
50 mg | Freq: Once | INTRAVENOUS | 0 refills | Status: CP
Start: 2021-11-23 — End: ?
  Administered 2021-11-23 (×2): 50 mg via INTRAVENOUS

## 2021-11-23 MED ORDER — DEXAMETHASONE SODIUM PHOS (PF) 10 MG/ML IJ SOLN
10 mg | Freq: Once | INTRAVENOUS | 0 refills | Status: CP
Start: 2021-11-23 — End: ?
  Administered 2021-11-23: 20:00:00 10 mg via INTRAVENOUS

## 2021-11-23 NOTE — Progress Notes
CHEMO NOTE  Verified chemo consent signed and in chart.    Blood return positive via: Port (Single)    BSA and dose double checked (agree with orders as written) with: yes     Labs/applicable tests checked: CBC and Comprehensive Metabolic Panel (CMP)    Chemo regimen: Drug/cycle/day C2D8  PACLitaxeL (TAXOL) 148.8 mg in sodium chloride 0.9% (non-DEHP/PVC) 274.8 mL IVPB (non-DEHP/PVC)    Rate verified and armband double check with second RN: yes    Patient education offered and stated understanding. Denies questions at this time.      Pt arrived to [facility] for Cycle 2 Day 8 of Taxol. Pt assessed and has no questions, concerns, or complaints at this time. Port flushed, blood return noted. Pt received treatment as ordered without incidence. Port flushed with heparin and deaccessed per protocol. Pt declined AVS/labs, discharged ambulatory with all belongings in stable condition.

## 2021-11-24 ENCOUNTER — Encounter: Admit: 2021-11-24 | Discharge: 2021-11-24 | Payer: BC Managed Care – PPO

## 2021-11-29 ENCOUNTER — Encounter: Admit: 2021-11-29 | Discharge: 2021-11-29 | Payer: BC Managed Care – PPO

## 2021-11-29 MED ORDER — PACLITAXEL 250ML IVPB
80 mg/m2 | Freq: Once | INTRAVENOUS | 0 refills
Start: 2021-11-29 — End: ?

## 2021-11-29 MED ORDER — FAMOTIDINE (PF) 20 MG/2 ML IV SOLN
20 mg | Freq: Once | INTRAVENOUS | 0 refills
Start: 2021-11-29 — End: ?

## 2021-11-29 MED ORDER — DEXAMETHASONE SODIUM PHOS (PF) 10 MG/ML IJ SOLN
10 mg | Freq: Once | INTRAVENOUS | 0 refills
Start: 2021-11-29 — End: ?

## 2021-11-29 MED ORDER — TRASTUZUMAB-ANNS IVPB
6 mg/kg | Freq: Once | INTRAVENOUS | 0 refills
Start: 2021-11-29 — End: ?

## 2021-11-30 ENCOUNTER — Encounter: Admit: 2021-11-30 | Discharge: 2021-11-30 | Payer: BC Managed Care – PPO

## 2021-11-30 DIAGNOSIS — C50411 Malignant neoplasm of upper-outer quadrant of right female breast: Secondary | ICD-10-CM

## 2021-11-30 MED ORDER — HEPARIN, PORCINE (PF) 100 UNIT/ML IV SYRG
500 [IU] | Freq: Once | 0 refills | Status: CP
Start: 2021-11-30 — End: ?

## 2021-11-30 MED ORDER — PACLITAXEL 250ML IVPB
80 mg/m2 | Freq: Once | INTRAVENOUS | 0 refills | Status: CP
Start: 2021-11-30 — End: ?
  Administered 2021-11-30 (×2): 148.8 mg via INTRAVENOUS

## 2021-11-30 MED ORDER — FAMOTIDINE (PF) 20 MG/2 ML IV SOLN
20 mg | Freq: Once | INTRAVENOUS | 0 refills | Status: CP
Start: 2021-11-30 — End: ?
  Administered 2021-11-30: 21:00:00 20 mg via INTRAVENOUS

## 2021-11-30 MED ORDER — DIPHENHYDRAMINE IVPB
50 mg | Freq: Once | INTRAVENOUS | 0 refills | Status: CP
Start: 2021-11-30 — End: ?
  Administered 2021-11-30 (×2): 50 mg via INTRAVENOUS

## 2021-11-30 MED ORDER — DEXAMETHASONE SODIUM PHOS (PF) 10 MG/ML IJ SOLN
10 mg | Freq: Once | INTRAVENOUS | 0 refills | Status: CP
Start: 2021-11-30 — End: ?
  Administered 2021-11-30: 21:00:00 10 mg via INTRAVENOUS

## 2021-11-30 NOTE — Patient Instructions
Call Immediately to report the following:  Unexplained bleeding or bleeding that will not stop  Difficulty swallowing  Shortness of breath, wheezing, or trouble breathing  Rapid, irregular heartbeat; chest pain  Dizziness, lightheadedness  Rash or cut that swells or turns red, feels hot or painful, or begin to ooze  Diarrhea   Uncontrolled nausea or vomiting  Fever of 100.4 F or higher, or chills    Important Phone Numbers:  Cancer Center Main Number (answered 24 hours a day) [phone]  Cancer Center Scheduling (appointments) [phone]  Social Worker [phone]  Nutritionist [phone]

## 2021-11-30 NOTE — Progress Notes
CHEMO NOTE  Verified chemo consent signed and in chart.    Blood return positive via: Port (Single)    BSA and dose double checked (agree with orders as written) with: yes     Labs/applicable tests checked: CBC and Comprehensive Metabolic Panel (CMP)    Chemo regimen: Drug/cycle/day Taxol IV C2D15    Rate verified and armband double check with second RN: yes    Patient education offered and stated understanding. Denies questions at this time.    Chemo consent signed on 10/12/21.  Pt tolerated Taxol IV C2D15 infusion without incident.  Pt denies need for AVS, left ambulatory at 1736.

## 2021-12-01 ENCOUNTER — Encounter: Admit: 2021-12-01 | Discharge: 2021-12-01 | Payer: BC Managed Care – PPO

## 2021-12-07 ENCOUNTER — Encounter: Admit: 2021-12-07 | Discharge: 2021-12-07 | Payer: BC Managed Care – PPO

## 2021-12-07 DIAGNOSIS — K219 Gastro-esophageal reflux disease without esophagitis: Secondary | ICD-10-CM

## 2021-12-07 DIAGNOSIS — D0511 Intraductal carcinoma in situ of right breast: Secondary | ICD-10-CM

## 2021-12-07 DIAGNOSIS — C50919 Malignant neoplasm of unspecified site of unspecified female breast: Secondary | ICD-10-CM

## 2021-12-07 DIAGNOSIS — J45909 Unspecified asthma, uncomplicated: Secondary | ICD-10-CM

## 2021-12-07 DIAGNOSIS — C50411 Malignant neoplasm of upper-outer quadrant of right female breast: Secondary | ICD-10-CM

## 2021-12-07 MED ORDER — HEPARIN, PORCINE (PF) 100 UNIT/ML IV SYRG
500 [IU] | Freq: Once | 0 refills | Status: CP
Start: 2021-12-07 — End: ?

## 2021-12-07 MED ORDER — DEXAMETHASONE SODIUM PHOS (PF) 10 MG/ML IJ SOLN
10 mg | Freq: Once | INTRAVENOUS | 0 refills | Status: CP
Start: 2021-12-07 — End: ?
  Administered 2021-12-07: 20:00:00 10 mg via INTRAVENOUS

## 2021-12-07 MED ORDER — FAMOTIDINE (PF) 20 MG/2 ML IV SOLN
20 mg | Freq: Once | INTRAVENOUS | 0 refills | Status: CP
Start: 2021-12-07 — End: ?
  Administered 2021-12-07: 20:00:00 20 mg via INTRAVENOUS

## 2021-12-07 MED ORDER — TRASTUZUMAB-ANNS IVPB
6 mg/kg | Freq: Once | INTRAVENOUS | 0 refills | Status: CP
Start: 2021-12-07 — End: ?
  Administered 2021-12-07 (×2): 476.9 mg via INTRAVENOUS

## 2021-12-07 MED ORDER — DIPHENHYDRAMINE IVPB
50 mg | Freq: Once | INTRAVENOUS | 0 refills | Status: CP
Start: 2021-12-07 — End: ?
  Administered 2021-12-07 (×2): 50 mg via INTRAVENOUS

## 2021-12-07 MED ORDER — PACLITAXEL 250ML IVPB
80 mg/m2 | Freq: Once | INTRAVENOUS | 0 refills | Status: CP
Start: 2021-12-07 — End: ?
  Administered 2021-12-07 (×2): 148.8 mg via INTRAVENOUS

## 2021-12-07 NOTE — Progress Notes
Name: Kristy Martinez          MRN: [id number]      DOB: November 11, 1961      AGE: 61 y.o.   DATE OF SERVICE: 12/07/2021    Subjective:             Reason for Visit:  Follow Up      Kristy Martinez is a 61 y.o. female.     Cancer Staging  Ductal carcinoma in situ (DCIS) of right breast  Staging form: Breast, AJCC 8th Edition  - Clinical stage from 08/17/2021: Stage 0 (cTis (DCIS), cN0, cM0, ER+, PR+, HER2: Not Assessed) - Signed by Scarlett Presto, PA-C on 08/17/2021  - Pathologic stage from 09/05/2021: Stage IA (pT1b, pN0, cM0, G2, ER-, PR-, HER2+) - Signed by Scarlett Presto, PA-C on 09/21/2021      History of Present Illness    DIAGNOSIS: right breast cancer   PAST ONCOLOGY HISTORY: Kristy Martinez is a 61 year old post menopausal female who underwent a routine screening mammogram which revealed a focal asymmetry with associated microcalcifications in the right breast. ?Diagnostic mammogram showed a persistent asymmetry with associated fine pleomorphic calcifications in aggregate measuring approximately 2.3 cm. ?There was no ultrasound correlate, therefore stereotactic right breast biopsy was performed on 08/11/21 ([place]). 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 On 08/29/2021 Dr Cyril Mourning performed a right lumpectomy at [facility]. Pathology showed IDC, grade II, measuring 0.8cm, ER<1%, PR 0%, HER2 3+ IHC. On 09/19/2021 Dr Cyril Mourning performed a right ALNB at [facility]. Pathology showed 0 LN involved (0/3).     BREAST IMAGING:  Mammogram: ?  -- Bilateral screening mammogram 07/22/21 ([place]) revealed no suspicious abnormality is seen in the left breast. In the upper outer right breast, middle/posterior depth, there is a focal asymmetry with associated microcalcifications (MLO slice 30 and CC slice 38). Recommend right diagnostic mammogram and targeted ultrasound for further evaluation.   -- Right diagnostic mammogram 08/08/21 ([place]) revealed a persistent 1.5 cm focal asymmetry with subtle architectural distortion within the upper outer right breast middle-posterior depth at approximately 10:00. The focal asymmetry has associated fine pleomorphic calcifications in aggregate measuring approximately 2.0 x 1.5 x 2.3 cm in the AP, TV and CC dimensions.   Ultrasound: ?  -- Targeted right breast ultrasound 08/08/21 ([place]) revealed no convincing ultrasound correlate is identified for the mammographic focal asymmetry of concern. 5 morphologically normal appearing right axillary lymph nodes are documented. Impression: Suspicious focal asymmetry with associated calcifications in the upper outer right breast at 10:00. Stereotactic/tomosynthesis guided biopsy is recommended.     CXR 09/19/2021:    Left chest port with the tip overlying the mid SVC. Small nodular opacity overlying the right costophrenic angle. Repeat 2 view chest radiographs with nipple markers is recommended. If this persists on follow-up imaging, CT chest is recommended for better characterization.     Medical History:   Diagnosis Date   ? Asthma    ? Breast cancer (HCC)    ? Ductal carcinoma in situ (DCIS) of right breast 07/2021   ? GERD (gastroesophageal reflux disease)      Surgical History:   Procedure Laterality Date   ? TUBAL LIGATION  1983   ? Right Radioactive Seed Localized Lumpectomy Right 08/29/2021    Performed by Ignatius Specking, MD at [facility]   ? RADIOLOGICAL EXAM SURGICAL SPECIMEN Right 08/29/2021    Performed by Ignatius Specking, MD at [facility]   ? ADJACENT TRANSFER/  REARRANGEMENT 10.1 SQ CM TO 30.0 SQ CM - TORSO Right 08/29/2021    Performed by Ignatius Specking, MD at [facility]   ? IDENTIFICATION SENTINEL LYMPH NODE Right 09/19/2021    Performed by Ignatius Specking, MD at [facility]   ? INJECTION RADIOACTIVE TRACER FOR SENTINEL NODE IDENTIFICATION Right 09/19/2021    Performed by Ignatius Specking, MD at [facility]   ? Right Sentinel Lymph Node Biopsy Right 09/19/2021    Performed by Ignatius Specking, MD at [facility]   ? INSERTION TUNNELED CENTRAL VENOUS ACCESS DEVICE WITH SUBCUTANEOUS PORT - AGE 62 YEARS AND OVER  09/19/2021    Performed by Dellia Cloud, DO at [facility]   ? FLUOROSCOPIC GUIDANCE CENTRAL VENOUS ACCESS DEVICE PLACEMENT/ REPLACEMENT/ REMOVAL  09/19/2021    Performed by Dellia Cloud, DO at [facility]   ? TONSILLECTOMY      at age 52      Family History   Problem Relation Age of Onset   ? Arthritis-osteo Mother    ? Thyroid Disease Father    ? Cancer-Breast Paternal Aunt 32   ? Cancer-Breast Maternal Grandmother         84s   ? Cancer-Prostate Maternal Grandfather    ? Diabetes Paternal Grandmother      Social History     Socioeconomic History   ? Marital status: Married   Tobacco Use   ? Smoking status: Never   ? Smokeless tobacco: Never   Vaping Use   ? Vaping Use: Never used   Substance and Sexual Activity   ? Alcohol use: Yes     Alcohol/week: 1.0 standard drink     Types: 1 Glasses of wine per week     Comment: monthly   ? Drug use: Not Currently     Vaping/E-liquid Use   ? Vaping Use Never User               OB/GYN HISTORY: Age at onset of menstruation 72. G21P1. She had her first child at age 64. LMP 45; uterus and ovaries intact. She never took HRT.     PRESENT THERAPY: adjuvant Taxol every week x12/Herceptin every 3 weeks.     Kristy Martinez is here today for adjuvant Taxol/Herceptin cycle #7/12.       Review of Systems   Genitourinary:        Post menopausal   Uterus and ovaries intact        No Known Allergies    Objective:         ? acetaminophen (TYLENOL) 325 mg tablet Take two tablets by mouth every 6 hours as needed for Pain. Indications: take two tabs every 6 hours for the first three days after surgery, then take as needed   ? citalopram (CELEXA) 20 mg tablet Take 20 mg by mouth daily.   ? fluticasone propionate (FLOVENT HFA) 110 mcg/actuation inhaler    ? lidocaine/prilocaine (EMLA) 2.5/2.5 % topical cream Apply a nickel size of the cream to the center of the port, 30 minutes prior to port access. Cover the site with non adhesive bandage.   ? LORazepam (ATIVAN) 0.5 mg tablet Take one tablet by mouth every 8 hours as needed for Nausea, Vomiting or Other... (insomnia). Max Daily Amount: 1.5 mg.   ? montelukast (SINGULAIR) 10 mg tablet    ? omeprazole DR (PRILOSEC) 40 mg capsule Take 40 mg by mouth daily before breakfast.   ? pantoprazole DR (  PROTONIX) 40 mg tablet    ? prochlorperazine maleate (COMPAZINE) 10 mg tablet Take one tablet by mouth every 6 hours as needed.     Vitals:    12/07/21 1245   BP: (!) 143/88   BP Source: Arm, Left Upper   Pulse: 92   Temp: 36.4 ?C (97.6 ?F)   Resp: 16   SpO2: 98%   O2 Device: None (Room air)   TempSrc: Temporal   PainSc: Zero   Weight: 79.2 kg (174 lb 9.6 oz)     Body mass index is 30.86 kg/m?Marland Kitchen     Pain Score: Zero       Fatigue Scale: 0-None    Pain Addressed:  N/A    Patient Evaluated for a Clinical Trial: No treatment clinical trial available for this patient.     [place] Cooperative Oncology Group performance status is 0, Fully active, able to carry on all pre-disease performance without restriction.Marland Kitchen     Physical Exam  Vitals and nursing note reviewed. Exam conducted with a chaperone present.   Pulmonary:      Effort: No respiratory distress.   Chest:   Breasts:     Right: No inverted nipple, mass, nipple discharge, skin change or tenderness.      Left: No inverted nipple, mass, nipple discharge, skin change or tenderness.       Lymphadenopathy:      Cervical: No cervical adenopathy.      Upper Body:      Right upper body: No supraclavicular or axillary adenopathy.      Left upper body: No supraclavicular or axillary adenopathy.              LABS/RADIOLOGY:  CBC w diff    Lab Results   Component Value Date/Time    WBC 5.6 12/07/2021 12:06 PM    RBC 3.87 (L) 12/07/2021 12:06 PM    HGB 12.0 12/07/2021 12:06 PM    HCT 35.2 (L) 12/07/2021 12:06 PM    MCV 90.9 12/07/2021 12:06 PM    MCH 30.9 12/07/2021 12:06 PM    MCHC 34.0 12/07/2021 12:06 PM    RDW 15.5 (H) 12/07/2021 12:06 PM    PLTCT 426 (H) 12/07/2021 12:06 PM    MPV 7.3 12/07/2021 12:06 PM    Lab Results Component Value Date/Time    NEUT 65 12/07/2021 12:06 PM    ANC 3.60 12/07/2021 12:06 PM    LYMA 22 (L) 12/07/2021 12:06 PM    ALC 1.20 12/07/2021 12:06 PM    MONA 8 12/07/2021 12:06 PM    AMC 0.40 12/07/2021 12:06 PM    EOSA 4 12/07/2021 12:06 PM    AEC 0.20 12/07/2021 12:06 PM    BASA 1 12/07/2021 12:06 PM    ABC 0.10 12/07/2021 12:06 PM        Comprehensive Metabolic Profile    Lab Results   Component Value Date/Time    NA 138 12/07/2021 12:06 PM    K 4.0 12/07/2021 12:06 PM    CL 103 12/07/2021 12:06 PM    CO2 29 12/07/2021 12:06 PM    GAP 6 12/07/2021 12:06 PM    BUN 13 12/07/2021 12:06 PM    CR 0.64 12/07/2021 12:06 PM    GLU 100 12/07/2021 12:06 PM    Lab Results   Component Value Date/Time    CA 8.8 12/07/2021 12:06 PM    ALBUMIN 4.0 12/07/2021 12:06 PM    TOTPROT 6.9 12/07/2021 12:06 PM  ALKPHOS 72 12/07/2021 12:06 PM    AST 15 12/07/2021 12:06 PM    ALT 18 12/07/2021 12:06 PM    TOTBILI 0.3 12/07/2021 12:06 PM          ECHO 10/05/2021: EF 65%    CXR 09/19/2021: Left chest port with the tip overlying the mid SVC. Small nodular opacity overlying the right costophrenic angle. Repeat 2 view chest radiographs with nipple markers is recommended. If this persists on follow-up imaging, CT chest is recommended for better characterization.     CXR 10/12/2021:Dense nodular opacity at the right lung base just lateral to the nipple marker probably represents a calcified granuloma. However, given recent diagnosis of breast cancer, consider further evaluation with chest CT.     CT chest 11/02/2021:  1. Calcified granuloma within the right lower lobe, corresponding to the nodular opacity seen on recent chest radiograph.   2. Several scattered tiny nonspecific pulmonary nodules. Follow-up CT chest in 6 months is recommended for reevaluation.   3. 1.6 cm left thyroid nodule. Dedicated thyroid ultrasound is recommended for further evaluation. ?        Assessment and Plan:  1. Kristy Martinez is a 61 year old post menopausal female with right breast IDC, grade II, measuring 0.8cm, ER/PR negative and HER2 3+ positive. S/P right lumpectomy and SLNB with 0 LN involved. She was seen initially by Dr Arliss Journey.   2. We have reviewed the patient's radiology and pathology results in detail with her and her husband.  We have reviewed the particular features of her cancer including stage, grade, hormone receptor and HER2 neu status.   3. Due to her cancer being HER2 positive; we are recommending adjuvant chemotherapy with Taxol weekly x12 and Herceptin every 3 weeks for one year. She will receive cycle #7/12 taxol/Herceptin today.   4. Repeat ECHO every 3 months while on Herceptin. Due 12/2021.   5. CXR from 09/19/2021 showed small nodular opacity overlying the right costophrenic angle. Repeat 2 view chest radiographs with nipple markers showed dense nodular opacity at the right lung base just lateral to the nipple marker probably represents a calcified granuloma. We will schedule a CT chest.  CT chest from 11/02/2021 showed calcified granuloma within the right lower lobe, corresponding to the nodular opacity seen on recent chest radiograph. Several scattered tiny nonspecific pulmonary nodules. Follow-up CT chest in 6 months is recommended for reevaluation; scheduled 04/2022.   6. Repeat thyroid ultrasound due to CT chest 11/02/2021 showed 1.6 cm left thyroid nodule. Dedicated thyroid ultrasound is recommended for further evaluation. ?Due 04/2022.   7. Bilateral mammogram to be followed by Dr Cyril Mourning. Due 06/2022.   8. She has seen Dr Isabella Stalling for breast radiation.    9. RTC in 3 weeks with labs and chemotherapy.     My collaborating MD Dr Raul Del.     For up-to-date information on COVID-19, visit the [facility] website. [email or web address]  ? General supportive care during cold and flu season and infection prevention reminders:   o Wash hands often with soap and water for at least 20 seconds or use hand sanitizer with at least 60% alcohol. o Cover your mouth and nose.  o Practice social distancing: Try to maintain 6 feet between you and people outside your household.  o Avoid crowds and poorly ventilated spaces.  o Stay home if sick and symptoms are mild or manageable. If you must be around other people, wear a mask and practice social distancing.     ?  If you are having symptoms of a lower respiratory infection (cough, shortness of breath) and/or fever or have been exposed to someone with COVID-19:    o Call your primary care provider for questions or health needs.   - Tell your doctor about your recent travel and your symptoms          In a medical emergency, call 911 or go to the nearest emergency room.      Elenore Paddy Mckensie Scotti, APRN-NP

## 2021-12-14 ENCOUNTER — Encounter: Admit: 2021-12-14 | Discharge: 2021-12-14 | Payer: BC Managed Care – PPO

## 2021-12-14 DIAGNOSIS — C50411 Malignant neoplasm of upper-outer quadrant of right female breast: Secondary | ICD-10-CM

## 2021-12-14 MED ORDER — ALTEPLASE 2 MG IK SOLR
2 mg | Freq: Once | INTRAMUSCULAR | 0 refills | Status: CP
Start: 2021-12-14 — End: ?
  Administered 2021-12-14: 19:00:00 2 mg via INTRAMUSCULAR

## 2021-12-14 MED ORDER — HEPARIN, PORCINE (PF) 100 UNIT/ML IV SYRG
500 [IU] | Freq: Once | 0 refills | Status: CP
Start: 2021-12-14 — End: ?

## 2021-12-14 MED ORDER — FAMOTIDINE (PF) 20 MG/2 ML IV SOLN
20 mg | Freq: Once | INTRAVENOUS | 0 refills | Status: CP
Start: 2021-12-14 — End: ?
  Administered 2021-12-14: 21:00:00 20 mg via INTRAVENOUS

## 2021-12-14 MED ORDER — PACLITAXEL 250ML IVPB
80 mg/m2 | Freq: Once | INTRAVENOUS | 0 refills | Status: CP
Start: 2021-12-14 — End: ?
  Administered 2021-12-14 (×2): 148.8 mg via INTRAVENOUS

## 2021-12-14 MED ORDER — DIPHENHYDRAMINE IVPB
50 mg | Freq: Once | INTRAVENOUS | 0 refills | Status: CP
Start: 2021-12-14 — End: ?
  Administered 2021-12-14 (×2): 50 mg via INTRAVENOUS

## 2021-12-14 MED ORDER — DEXAMETHASONE SODIUM PHOS (PF) 10 MG/ML IJ SOLN
10 mg | Freq: Once | INTRAVENOUS | 0 refills | Status: CP
Start: 2021-12-14 — End: ?
  Administered 2021-12-14: 21:00:00 10 mg via INTRAVENOUS

## 2021-12-14 NOTE — Progress Notes
Received report from Judson Roch, Therapist, sports. Pt received Taxol without incident. Port positive for blood return and flushed with heparin prior to de-access. Pt left CC treatment with all personal belongings in stable condition.

## 2021-12-14 NOTE — Progress Notes
CHEMO NOTE  Verified chemo consent signed and in chart.    Blood return positive via: Port (Single)    BSA and dose double checked (agree with orders as written) with: yes See MAR    Labs/applicable tests checked: CBC and Comprehensive Metabolic Panel (CMP)    Chemo regimen:   PACLitaxeL (TAXOL) 148.8 mg in sodium chloride 0.9% (non-DEHP/PVC) 274.8 mL IVPB     Rate verified and armband double check with second RN: yes    Patient education offered and stated understanding. Denies questions at this time.    Patient arrived to CC treatment for [facility] Taxol. VS obtained and assessment completed. Taxol started at 1533. Report given to B. Cousin Therapist, sports at 1600.

## 2021-12-21 ENCOUNTER — Encounter: Admit: 2021-12-21 | Discharge: 2021-12-21 | Payer: BC Managed Care – PPO

## 2021-12-21 DIAGNOSIS — C50411 Malignant neoplasm of upper-outer quadrant of right female breast: Secondary | ICD-10-CM

## 2021-12-21 MED ORDER — HEPARIN, PORCINE (PF) 100 UNIT/ML IV SYRG
500 [IU] | Freq: Once | 0 refills | Status: CP
Start: 2021-12-21 — End: ?

## 2021-12-21 MED ORDER — PACLITAXEL 250ML IVPB
80 mg/m2 | Freq: Once | INTRAVENOUS | 0 refills | Status: CP
Start: 2021-12-21 — End: ?
  Administered 2021-12-21 (×2): 148.8 mg via INTRAVENOUS

## 2021-12-21 MED ORDER — DIPHENHYDRAMINE IVPB
50 mg | Freq: Once | INTRAVENOUS | 0 refills | Status: CP
Start: 2021-12-21 — End: ?
  Administered 2021-12-21 (×2): 50 mg via INTRAVENOUS

## 2021-12-21 MED ORDER — FAMOTIDINE (PF) 20 MG/2 ML IV SOLN
20 mg | Freq: Once | INTRAVENOUS | 0 refills | Status: CP
Start: 2021-12-21 — End: ?
  Administered 2021-12-21: 21:00:00 20 mg via INTRAVENOUS

## 2021-12-21 MED ORDER — DEXAMETHASONE SODIUM PHOS (PF) 10 MG/ML IJ SOLN
10 mg | Freq: Once | INTRAVENOUS | 0 refills | Status: CP
Start: 2021-12-21 — End: ?
  Administered 2021-12-21: 21:00:00 10 mg via INTRAVENOUS

## 2021-12-21 NOTE — Progress Notes
CHEMO NOTE  Verified chemo consent signed and in chart.    Blood return positive via: Port (Single, Power Port and Accessed)    BSA and dose double checked (agree with orders as written) with: yes     Labs/applicable tests checked: CBC and Comprehensive Metabolic Panel (CMP)    Chemo regimen: Drug/cycle/day C3 D15 Taxol    Rate verified and armband double check with second RN: yes    Patient education offered and stated understanding. Denies questions at this time.    Voices no concerns or complaints.  Taxol infused without adverse incident.  Dismissed alert and ambulatory with spouse at 82.

## 2021-12-28 ENCOUNTER — Encounter: Admit: 2021-12-28 | Discharge: 2021-12-28 | Payer: BC Managed Care – PPO

## 2021-12-28 ENCOUNTER — Ambulatory Visit: Admit: 2021-12-28 | Discharge: 2021-12-28 | Payer: BC Managed Care – PPO

## 2021-12-28 DIAGNOSIS — K219 Gastro-esophageal reflux disease without esophagitis: Secondary | ICD-10-CM

## 2021-12-28 DIAGNOSIS — C50411 Malignant neoplasm of upper-outer quadrant of right female breast: Secondary | ICD-10-CM

## 2021-12-28 DIAGNOSIS — C50919 Malignant neoplasm of unspecified site of unspecified female breast: Secondary | ICD-10-CM

## 2021-12-28 DIAGNOSIS — J45909 Unspecified asthma, uncomplicated: Secondary | ICD-10-CM

## 2021-12-28 DIAGNOSIS — D0511 Intraductal carcinoma in situ of right breast: Secondary | ICD-10-CM

## 2021-12-28 MED ORDER — DEXAMETHASONE SODIUM PHOS (PF) 10 MG/ML IJ SOLN
10 mg | Freq: Once | INTRAVENOUS | 0 refills | Status: CP
Start: 2021-12-28 — End: ?
  Administered 2021-12-28: 21:00:00 10 mg via INTRAVENOUS

## 2021-12-28 MED ORDER — DIPHENHYDRAMINE HCL 25 MG PO CAP
25 mg | Freq: Once | ORAL | 0 refills | Status: AC
Start: 2021-12-28 — End: ?

## 2021-12-28 MED ORDER — TRASTUZUMAB-ANNS IVPB
6 mg/kg | Freq: Once | INTRAVENOUS | 0 refills | Status: CP
Start: 2021-12-28 — End: ?
  Administered 2021-12-28 (×2): 476.9 mg via INTRAVENOUS

## 2021-12-28 MED ORDER — PACLITAXEL 250ML IVPB
80 mg/m2 | Freq: Once | INTRAVENOUS | 0 refills | Status: CP
Start: 2021-12-28 — End: ?
  Administered 2021-12-28 (×2): 148.8 mg via INTRAVENOUS

## 2021-12-28 MED ORDER — DIPHENHYDRAMINE HCL 25 MG PO CAP
25 mg | Freq: Once | ORAL | 0 refills | Status: CP
Start: 2021-12-28 — End: ?
  Administered 2021-12-28: 21:00:00 25 mg via ORAL

## 2021-12-28 MED ORDER — FAMOTIDINE (PF) 20 MG/2 ML IV SOLN
20 mg | Freq: Once | INTRAVENOUS | 0 refills | Status: CP
Start: 2021-12-28 — End: ?
  Administered 2021-12-28: 21:00:00 20 mg via INTRAVENOUS

## 2021-12-28 NOTE — Progress Notes
CHEMO NOTE  Verified chemo consent signed and in chart.    Blood return positive via: Port (Single and Power Port)    BSA and dose double checked (agree with orders as written) with: yes see MAR    Labs/applicable tests checked: CBC, Comprehensive Metabolic Panel (CMP) and Echocardiogram    Chemo regimen: Drug/cycle/day: C4D1   -trastuzumab-anns (KANJINTI) 476.9 mg in sodium chloride 0.9% (NS) 272.7095 mL IVPB     -PACLitaxeL (TAXOL) 148.8 mg in sodium chloride 0.9% (non-DEHP/PVC) 274.8 mL IVPB     Rate verified and armband double check with second RN: yes    Patient education offered and stated understanding. Denies questions at this time.      Patient arrived to [facility] treatment for C4D1 of Kanjinti/Taxol. Patient has no question, concerns or complaints at this time. 1716 Pt received treatment as ordered without incidences. Pt's PAC was flushed and deaccessed per protocol. Pt left ambulatory with no further questions.

## 2021-12-29 ENCOUNTER — Encounter: Admit: 2021-12-29 | Discharge: 2021-12-29 | Payer: BC Managed Care – PPO

## 2022-01-01 ENCOUNTER — Encounter: Admit: 2022-01-01 | Discharge: 2022-01-01 | Payer: BC Managed Care – PPO

## 2022-01-03 ENCOUNTER — Encounter: Admit: 2022-01-03 | Discharge: 2022-01-03 | Payer: BC Managed Care – PPO

## 2022-01-04 ENCOUNTER — Encounter: Admit: 2022-01-04 | Discharge: 2022-01-04 | Payer: BC Managed Care – PPO

## 2022-01-04 DIAGNOSIS — C50411 Malignant neoplasm of upper-outer quadrant of right female breast: Secondary | ICD-10-CM

## 2022-01-04 MED ORDER — PACLITAXEL 250ML IVPB
80 mg/m2 | Freq: Once | INTRAVENOUS | 0 refills | Status: CP
Start: 2022-01-04 — End: ?
  Administered 2022-01-04 (×2): 148.8 mg via INTRAVENOUS

## 2022-01-04 MED ORDER — DIPHENHYDRAMINE HCL 25 MG PO CAP
25 mg | Freq: Once | ORAL | 0 refills | Status: CP
Start: 2022-01-04 — End: ?
  Administered 2022-01-04: 20:00:00 25 mg via ORAL

## 2022-01-04 MED ORDER — DEXAMETHASONE SODIUM PHOS (PF) 10 MG/ML IJ SOLN
10 mg | Freq: Once | INTRAVENOUS | 0 refills | Status: CP
Start: 2022-01-04 — End: ?
  Administered 2022-01-04: 20:00:00 10 mg via INTRAVENOUS

## 2022-01-04 MED ORDER — HEPARIN, PORCINE (PF) 100 UNIT/ML IV SYRG
500 [IU] | Freq: Once | 0 refills | Status: CP
Start: 2022-01-04 — End: ?

## 2022-01-04 MED ORDER — FAMOTIDINE (PF) 20 MG/2 ML IV SOLN
20 mg | Freq: Once | INTRAVENOUS | 0 refills | Status: CP
Start: 2022-01-04 — End: ?
  Administered 2022-01-04: 20:00:00 20 mg via INTRAVENOUS

## 2022-01-04 NOTE — Progress Notes
CHEMO NOTE  Verified chemo consent signed and in chart.    Blood return positive via: Port (Single, Power Port and Accessed)    BSA and dose double checked (agree with orders as written) with: yes see MAR    Labs/applicable tests checked: CBC and Comprehensive Metabolic Panel (CMP)    Chemo regimen: Drug/cycle/dayPACLitaxeL (TAXOL) 148.8 mg in sodium chloride 0.9% (non-DEHP/PVC) 274.8 mL IVPB     Rate verified and armband double check with second RN: yes    Patient education offered and stated understanding. Denies questions at this time.    Pt here for Taxol C4D8.  PAC flushed w NS, good blood return.  Pt tolerated infusion well.  Port flushed per protocol and deaccessed.   Pt left ambulatory, no complaints.

## 2022-01-11 ENCOUNTER — Encounter: Admit: 2022-01-11 | Discharge: 2022-01-11 | Payer: BC Managed Care – PPO

## 2022-01-11 DIAGNOSIS — C50411 Malignant neoplasm of upper-outer quadrant of right female breast: Secondary | ICD-10-CM

## 2022-01-11 MED ORDER — PACLITAXEL 250ML IVPB
80 mg/m2 | Freq: Once | INTRAVENOUS | 0 refills | Status: CP
Start: 2022-01-11 — End: ?
  Administered 2022-01-11 (×2): 148.8 mg via INTRAVENOUS

## 2022-01-11 MED ORDER — DIPHENHYDRAMINE HCL 25 MG PO CAP
25 mg | Freq: Once | ORAL | 0 refills | Status: CP
Start: 2022-01-11 — End: ?
  Administered 2022-01-11: 21:00:00 25 mg via ORAL

## 2022-01-11 MED ORDER — DEXAMETHASONE SODIUM PHOS (PF) 10 MG/ML IJ SOLN
10 mg | Freq: Once | INTRAVENOUS | 0 refills | Status: CP
Start: 2022-01-11 — End: ?
  Administered 2022-01-11: 21:00:00 10 mg via INTRAVENOUS

## 2022-01-11 MED ORDER — HEPARIN, PORCINE (PF) 100 UNIT/ML IV SYRG
500 [IU] | Freq: Once | 0 refills | Status: CP
Start: 2022-01-11 — End: ?

## 2022-01-11 MED ORDER — FAMOTIDINE (PF) 20 MG/2 ML IV SOLN
20 mg | Freq: Once | INTRAVENOUS | 0 refills | Status: CP
Start: 2022-01-11 — End: ?
  Administered 2022-01-11: 21:00:00 20 mg via INTRAVENOUS

## 2022-01-11 NOTE — Progress Notes
CHEMO NOTE  Verified chemo consent signed and in chart.    Blood return positive via: Port (Single)    BSA and dose double checked (agree with orders as written) with: yes      Labs/applicable tests checked: CBC and Comprehensive Metabolic Panel (CMP)    Chemo regimen: Drug/cycle/day C4D15n Taxol    Rate verified and armband double check with second RN: yes    Patient education offered and stated understanding. Denies questions at this time.

## 2022-01-12 ENCOUNTER — Encounter: Admit: 2022-01-12 | Discharge: 2022-01-12 | Payer: BC Managed Care – PPO

## 2022-01-12 ENCOUNTER — Ambulatory Visit: Admit: 2022-01-12 | Discharge: 2022-01-12 | Payer: BC Managed Care – PPO

## 2022-01-12 DIAGNOSIS — C50411 Malignant neoplasm of upper-outer quadrant of right female breast: Secondary | ICD-10-CM

## 2022-01-12 DIAGNOSIS — C50919 Malignant neoplasm of unspecified site of unspecified female breast: Secondary | ICD-10-CM

## 2022-01-12 DIAGNOSIS — J45909 Unspecified asthma, uncomplicated: Secondary | ICD-10-CM

## 2022-01-12 DIAGNOSIS — K219 Gastro-esophageal reflux disease without esophagitis: Secondary | ICD-10-CM

## 2022-01-12 DIAGNOSIS — D0511 Intraductal carcinoma in situ of right breast: Secondary | ICD-10-CM

## 2022-01-12 NOTE — Progress Notes
Radiation Oncology Follow Up Note  Date: 01/12/2022       Marifer Amaya is a 61 y.o. female.     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 ER 70%, PR 10%. Diagnosed 07/2021.  On 08/29/2021, the patient underwent right breast lumpectomy and was found to have a ER less than 1%, PR 0, HER2 3+ invasive ductal carcinoma, histologic grade 2 with DCIS, high nuclear grade.  Primary tumor measured 0.8 x 0.2 cm with EIC, no LVI and no nodes were sampled.    History of Present Illness    Phenix Malo is a 61 y.o. female.  Her oncologic history is briefly as follows:  ?  -The patient is a lovely 61 year old female who reports that she did not have any adverse findings or breast concerns prior to presenting for screening mammogram.  - 07/22/2021 ([place]) bilateral screening mammogram: No suspicious abnormality in the left breast.  In the upper outer right breast, middle posterior depth there is a focal asymmetry with associated microcalcifications.  - 08/08/2021 ([place]) right diagnostic mammogram: Persistent 1.5 cm focal asymmetry with subtle architectural distortion in the upper outer right breast middle posterior depth at approximately 10:00, with associated fine pleomorphic calcifications.  The calcifications in aggregate measure approximately 2.0 x 1.5 x 2.3 cm.  - 08/08/2021 ([place]) targeted right breast ultrasound: No convincing ultrasound correlate identified for mammographic asymmetry of concern.  5 morphologically normal-appearing right axillary lymph nodes.  Suspicious focal asymmetry with associated calcifications upper outer right breast 10:00, biopsy recommended.  - 08/11/2021 ([place]) biopsy right breast upper outer quadrant 10:00: ER positive, PR positive, intermediate to high nuclear grade ductal carcinoma in situ.  -08/29/2021 right radioactive seed localized lumpectomy.  The final pathology showing a small focus of invasive ductal carcinoma, grade 2 with extensive intraductal component, high nuclear grade.  ER less than 1%, PR 0, HER2 3+.  -On 09/19/2021 the patient underwent right axillary sentinel node biopsy.  Final pathology confirmed 0 of 3 lymph nodes to be involved with metastatic carcinoma.       Review of Systems   Constitutional: Negative.    HENT: Negative.    Eyes: Negative.    Respiratory: Negative.  Negative for shortness of breath.    Cardiovascular: Negative.  Negative for chest pain.   Gastrointestinal: Negative.  Negative for nausea and vomiting.   Endocrine: Negative.    Genitourinary: Negative.    Musculoskeletal: Negative.    Skin: Negative.    Allergic/Immunologic: Negative.    Neurological: Negative.  Negative for headaches.   Hematological: Negative.    Psychiatric/Behavioral: Negative.    All other systems reviewed and are negative.        Subjective:          Mrs. Duey returns today for follow-up after sentinel node biopsy.  Fortunately for the patient, her sentinel node biopsy showed no evidence of metastatic disease in the sentinel  node sampled.  She is accompanied by her husband.  We reviewed her diagnosis and the indications for adjuvant radiation therapy.  Patient reports that she is feeling great from her surgery and had her last chemotherapy yesterday.    Objective:         ? acetaminophen (TYLENOL) 325 mg tablet Take two tablets by mouth every 6 hours as needed for Pain. Indications: take two tabs every 6 hours for the first three days after surgery, then take as needed   ? citalopram (CELEXA) 20 mg tablet Take one tablet by mouth daily.   ? lidocaine/prilocaine (EMLA) 2.5/2.5 % topical cream Apply a nickel size of the cream to the center of the port, 30 minutes prior to port access. Cover the site with non adhesive bandage.   ? LORazepam (ATIVAN) 0.5 mg tablet Take one tablet by mouth every 8 hours as needed for Nausea, Vomiting or Other... (insomnia). Max Daily Amount: 1.5 mg.   ? pantoprazole DR (PROTONIX) 40 mg tablet    ? prochlorperazine maleate (COMPAZINE) 10 mg tablet Take one tablet by mouth every 6 hours as needed.     Vitals:    01/12/22 1452   BP: (!) 144/83   Pulse: 103   Temp: 36.3 ?C (97.3 ?F)   Resp: 18   SpO2: 99%   PainSc: Zero   Weight: 83.3 kg (183 lb 9.6 oz)     Body mass index is 32.53 kg/m?Marland Kitchen     Pain Score: Zero        Fatigue Scale: 0-None    KARNOFSKY PERFORMANCE SCORE:  100% Normal, no complaints     Physical Exam  Vitals and nursing note reviewed.   Constitutional:       General: She is not in acute distress.     Appearance: Normal appearance. She is not ill-appearing.   HENT:      Head: Normocephalic and atraumatic.   Eyes:      Extraocular Movements: Extraocular movements intact.   Pulmonary:      Effort: Pulmonary effort is normal. No respiratory distress.   Musculoskeletal:         General: Normal range of motion.   Skin:     General: Skin is warm and dry.   Neurological:      General: No focal deficit present.      Mental Status: She is alert and oriented to person, place, and time. Mental status is at baseline.      Cranial Nerves: No cranial nerve deficit.   Psychiatric:         Mood and Affect: Mood normal.         Behavior: Behavior normal.         Thought Content: Thought content normal.         Judgment: Judgment normal.              Laboratory:    Comprehensive Metabolic Profile    Lab Results   Component Value Date/Time    NA 140 01/11/2022 01:31 PM    K 3.6 01/11/2022 01:31 PM    CL 104 01/11/2022 01:31 PM    CO2 31 (H) 01/11/2022 01:31 PM    GAP 5 01/11/2022 01:31 PM    BUN 13 01/11/2022 01:31 PM    CR 0.72 01/11/2022 01:31 PM    GLU 136 (H) 01/11/2022 01:31 PM    Lab Results   Component Value Date/Time    CA 8.6 01/11/2022 01:31 PM  ALBUMIN 3.8 01/11/2022 01:31 PM    TOTPROT 6.3 01/11/2022 01:31 PM    ALKPHOS 64 01/11/2022 01:31 PM    AST 16 01/11/2022 01:31 PM    ALT 18 01/11/2022 01:31 PM    TOTBILI 0.3 01/11/2022 01:31 PM        CBC w diff    Lab Results   Component Value Date/Time    WBC 5.7 01/11/2022 01:31 PM    RBC 3.61 (L) 01/11/2022 01:31 PM    HGB 11.3 (L) 01/11/2022 01:31 PM    HCT 33.6 (L) 01/11/2022 01:31 PM    MCV 93.1 01/11/2022 01:31 PM    MCH 31.4 01/11/2022 01:31 PM    MCHC 33.7 01/11/2022 01:31 PM    RDW 16.9 (H) 01/11/2022 01:31 PM    PLTCT 360 01/11/2022 01:31 PM    MPV 7.3 01/11/2022 01:31 PM    Lab Results   Component Value Date/Time    NEUT 66 01/11/2022 01:31 PM    ANC 3.80 01/11/2022 01:31 PM    LYMA 23 (L) 01/11/2022 01:31 PM    ALC 1.30 01/11/2022 01:31 PM    MONA 6 01/11/2022 01:31 PM    AMC 0.30 01/11/2022 01:31 PM    EOSA 4 01/11/2022 01:31 PM    AEC 0.20 01/11/2022 01:31 PM    BASA 1 01/11/2022 01:31 PM    ABC 0.10 01/11/2022 01:31 PM            Imaging:   2D + DOPPLER ECHO  ? Preserved LV systolic function, EF estimated 55 to 60%.  Global   functional strain is -20%) within normal limits).  ? Normal LV cavity size.  ? No regional wall motion abnormalities.  ? Normal bilateral atria.  ? No clinically significant valve disease detected.  ? CVP estimated 3 mmHg.  Peak PA systolic pressure estimated 24 mmHg.  ? No pericardial effusion.    Compared to a prior November 2022 study.  Continue demonstration of   preserved LV systolic function.  The global logical strain remains   preserved previously assessed as -23% now currently -20%.  No interval   development of regional wall motion abnormality or clinically significant   valve disease.  Overall no significant changes. Path:  PATHOLOGY REPORT   Date Value Ref Range Status   09/19/2021   Final    [facility]  [email or web address]    Department of Pathology and Laboratory Medicine  [address], [place], [place] [address]  Surgical Pathology Office:  [phone]  Fax:  [phone]  SURGICAL PATHOLOGY REPORT    NAME: MACIE, BAUM SURG PATH #: [id number] MR #: [id number] SPECIMEN  CLASS: SI BILLING #: [id number] ALT ID #:  LOCATION: [id number] DATE OF  PROCEDURE: 09/19/2021 AGE:  60 SEX: F DATE RECEIVED: 09/19/2021 DOB:  [birth year]  TIME RECEIVED:  11:02 PHYSICIAN: Ignatius Specking, MD DATE OF  REPORT: 09/21/2021 COPY TO:  DATE OF PRINTING: 09/21/2021         ########################################################################  Final Diagnosis:    A. Lymph nodes, right axillary sentinel lymph nodes, biopsy:    Three lymph nodes, negative for metastatic carcinoma (0/3).  Deeper sections and pancytokeratin staining support the absence of  carcinoma.       Attestation:  By this signature, I attest that I have personally formulated the final  interpretation expressed in this report and that the above diagnosis is  based upon my examination of the slides and/or other material indicated in  this report.    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         Gross Description:  A. Received fresh, labeled with the patient's name and right axillary  sentinel lymph nodes, are three tan-yellow lymph nodes measuring 0.7-1.2  cm in greatest dimension. The specimen is entirely submitted as follows:  A1      2 possible lymph nodes, bisected (inked black and blue).   A2      1 possible lymph node, serially sectioned.   A3      Remainder of fibroadipose tissue with possible minute lymph nodes.  (ket)    kp/09/19/2021           If immunohistochemical stains and/or in situ hybridization are cited in  this report, the performance characteristics were determined by the  Department of Pathology and Laboratory Medicine of the [facility] of  [facility]) in compliance with CLIA'88  regulations. Some of these tests rely on the use of analyte specific  reagents and are subject to specific labeling requirements by the FDA.  The stains are performed on formalin-fixed, paraffin-embedded tissue,  unless otherwise stated. Known positive and negative control tissues  demonstrate appropriate staining. Results should be interpreted with  caution given the likelihood of false negativity on decalcified specimens.  This testing was developed by the Department of Pathology and Laboratory  Medicine of the [facility] [place]. It has not been cleared or approved  by the FDA.  The FDA has determined that such clearance or approval is not  necessary.    Professional services performed by [facility], [place], at [address] [place], [place]  [address]     ]         Assessment and Plan:    Mrs. Banken is a lovely 61 year old female diagnosed with grade 2 invasive ductal carcinoma with extensive intraductal component, high nuclear grade.  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  She status post breast conserving surgery without axillary lymph node sampling on 08/29/2021.  She was found to have invasive ductal carcinoma ER less than 1% PR 0%, HER2 3+.  On 09/19/2021 she had right axillary sentinel lymph node biopsy which confirmed a 0 of 3 sentinel nodes containing metastatic disease.  She received her final chemotherapy on 01/11/2022.  She will continue to complete 1 year of Herceptin.  Will benefit from adjuvant radiation therapy to complete breast conserving treatment.        I provided the patient with a general overview of radiation therapy in the setting of breast conserving treatment.  With respect to post-lumpectomy radiation, we have discussed different dose and fractionation schedules, to include standard fractionation as well as hypofractionated regimens.  For patients receiving whole breast radiation therapy alone, hypofractionated regimen is generally offered if feasible. We discussed that recent long term studies from [place] and the [place] show equivalent rates of tumor control with similar or possibly reduced cosmetic changes in the setting of hypofractionation versus conventional fractionation Particia Nearing, Lancet Oncol, 2013; Luiz Blare, 2010). In light of these results, patients requiring whole breast radiation after BCS a hypofractionated  approach over 3-4 weeks has become part of the standard of care recommendations.  I have offered her a course of whole breast radiation therapy to the right breast to a total of 4256 cGy in 16 fractions.  I plan to omit the boost for this patient with widely negative margins.    The patient will continue to receive Herceptin to complete 1 year.    I also reviewed the treatment planning process, to include simulation, planning, and treatment delivery. I discussed the risks, benefits, acute and late side effects that may be experienced with radiation treatment. These risks to include, but not limited to, skin reaction, redness, tenderness, peeling, desquamation, pain, lymphedema, pneumonitis, fibrosis, and fatigue.   She asked appropriate questions and these were answered to her satisfaction. She acknowledged understanding of these risks, and I believe that she does.  I reviewed the purpose and events that occurred during simulation, steps taken during the treatment planning process to protect critical structures while delivering appropriate doses to the areas of concern, and the logistics of daily treatment.  She had ample opportunity to ask her questions and have her concerns addressed.      I will plan to schedule the patient for CT simulation for radiation treatment planning in approximately 2 to 3 weeks.  Our goal will be to begin adjuvant radiation therapy treatment course approximately 3 to 4 weeks after her last chemotherapy.    All the patient's questions as well as those of her husband were answered to their satisfaction.  Thank you for the opportunity be involved in her care please not hesitate contact me if there are any questions regarding today's visit.                  Elby Showers, MD    I spent a total of  30 minutes today in direct patient evaluation with more than 50% spent in treatment plan formulation, counseling, symptom management discussion and consultation as outlined above.     Parts of this note were created using voice recognition software.  Please excuse any grammatical or typographical errors.

## 2022-01-12 NOTE — Progress Notes
Treatment Planning Note    Encounter Date: 01/12/2022 at 3:24 PM  Creation Date: 01/12/2022     Kristy Martinez is a 61 y.o. female patient that presents with Malignant neoplasm of upper-outer quadrant of right breast in female, estrogen receptor positive ([place]) [C50.411, Z17.0].      The patient will be treated with:     TX Technique: 3D Radiation Therapy     Laterality: Right      Specific Area Treatment Site: Whole breast    Number of Treatments: up to 4256 cGy in up to 16 fractions No boost      Medical Imaging will be utilized to help ensure accuracy of positioning and field configuration.     I have personally formulated the patients clinical treatment plan.  A finalized treatment plan will be formulated, approved, and available in the patient's chart post simulation and pretreatment.     Special Treatment Required: No    Concurrent Chemo: Yes (Comment: Herceptin)    There are no questions and answers to display.       This document is electronically approved by Noland Fordyce, MD.

## 2022-01-16 ENCOUNTER — Encounter: Admit: 2022-01-16 | Discharge: 2022-01-16 | Payer: BC Managed Care – PPO

## 2022-01-17 ENCOUNTER — Encounter: Admit: 2022-01-17 | Discharge: 2022-01-17 | Payer: BC Managed Care – PPO

## 2022-01-17 MED ORDER — TRASTUZUMAB-ANNS IVPB
6 mg/kg | Freq: Once | INTRAVENOUS | 0 refills
Start: 2022-01-17 — End: ?

## 2022-01-18 ENCOUNTER — Encounter: Admit: 2022-01-18 | Discharge: 2022-01-18 | Payer: BC Managed Care – PPO

## 2022-01-18 DIAGNOSIS — C50411 Malignant neoplasm of upper-outer quadrant of right female breast: Secondary | ICD-10-CM

## 2022-01-18 MED ORDER — TRASTUZUMAB-ANNS IVPB
6 mg/kg | Freq: Once | INTRAVENOUS | 0 refills | Status: CP
Start: 2022-01-18 — End: ?
  Administered 2022-01-18 (×2): 466.2 mg via INTRAVENOUS

## 2022-01-18 MED ORDER — HEPARIN, PORCINE (PF) 100 UNIT/ML IV SYRG
500 [IU] | Freq: Once | 0 refills | Status: CP
Start: 2022-01-18 — End: ?

## 2022-01-18 NOTE — Progress Notes
CHEMO NOTE  Verified chemo consent signed and in chart.    Blood return positive via: Port (Single)    BSA and dose double checked (agree with orders as written) with: yes, see MAR    Labs/applicable tests checked: Echocardiogram    Chemo regimen: Drug/cycle/day C5D1  - trastuzumab-anns (KANJINTI) 466.2 mg in sodium chloride 0.9% (NS) 272.2 mL IVPB       Rate verified and armband double check with second RN: yes    Patient education offered and stated understanding. Denies questions at this time.      Patient presents for treatment for C5D1 kanjinti. PAC accessed, flushed and postive for blood return. Patient tolerated treatment without complications. PAC flushed per protocol and de accessed. Patient ambulated from cc in stable condition.

## 2022-01-26 ENCOUNTER — Encounter: Admit: 2022-01-26 | Discharge: 2022-01-26 | Payer: BC Managed Care – PPO

## 2022-01-26 ENCOUNTER — Ambulatory Visit: Admit: 2022-01-26 | Discharge: 2022-01-26 | Payer: BC Managed Care – PPO

## 2022-01-26 NOTE — Progress Notes
Simulation Procedure Note    Date: 01/26/2022   Kristy Martinez is a 61 y.o. female.     The encounter diagnosis was Malignant neoplasm of upper-outer quadrant of right breast in female, estrogen receptor positive ([place]).  Anatomical Site: Right breast    On 01/26/2022,  [name] underwent 3-DIMENSIONAL RADIATION THERAPY  simulation procedure on the CT Simulator.      Informed consent has been obtained for the radiation oncology treatment.     The chest region was scanned in order to establish treatment fields for radiation therapy and is medically necessary to establish the treatment fields to the right breast.    During the simulation process [name] was setup in the supine position and a customized wing board immobilization device was used.   Wire markers and no IV  contrast agent(s) were administered under my supervision to help visualize the base of interest.    Axial CT images were acquired through the region of interest in order to establish the appropriate isocenter.  These images will be transferred to the treatment planning system in order to identify the dose volumes for the targeted treatment area and for delineation of the normal critical structures, including the heart, lungs, opposite breast which are in close proximity of the targeted area of treatment.                   Noland Fordyce, MD

## 2022-02-01 ENCOUNTER — Encounter: Admit: 2022-02-01 | Discharge: 2022-02-01 | Payer: BC Managed Care – PPO

## 2022-02-08 ENCOUNTER — Encounter: Admit: 2022-02-08 | Discharge: 2022-02-08 | Payer: BC Managed Care – PPO

## 2022-02-08 ENCOUNTER — Ambulatory Visit: Admit: 2022-02-08 | Discharge: 2022-02-08 | Payer: BC Managed Care – PPO

## 2022-02-08 DIAGNOSIS — C50411 Malignant neoplasm of upper-outer quadrant of right female breast: Secondary | ICD-10-CM

## 2022-02-08 MED ORDER — TRASTUZUMAB-ANNS IVPB
6 mg/kg | Freq: Once | INTRAVENOUS | 0 refills | Status: CP
Start: 2022-02-08 — End: ?
  Administered 2022-02-08 (×2): 466.2 mg via INTRAVENOUS

## 2022-02-08 NOTE — Progress Notes
CHEMO NOTE  Verified chemo consent signed and in chart.    Blood return positive via: Port (Single and Accessed)    BSA and dose double checked (agree with orders as written) with: yes     Labs/applicable tests checked: Echocardiogram    Chemo regimen: Drug/cycle/day  C2D1  trastuzumab-anns (KANJINTI) 466.2 mg in sodium chloride 0.9% (NS) 272.2 mL IVPB     Rate verified and armband double check with second RN: yes    Patient education offered and stated understanding. Denies questions at this time.    Treatment initiated and completed as ordered without incidence. Patient left treatment ambulatory, alert and oriented.

## 2022-02-09 ENCOUNTER — Ambulatory Visit: Admit: 2022-02-09 | Discharge: 2022-02-09 | Payer: BC Managed Care – PPO

## 2022-02-09 ENCOUNTER — Encounter: Admit: 2022-02-09 | Discharge: 2022-02-09 | Payer: BC Managed Care – PPO

## 2022-02-09 DIAGNOSIS — D0511 Intraductal carcinoma in situ of right breast: Secondary | ICD-10-CM

## 2022-02-09 DIAGNOSIS — C50919 Malignant neoplasm of unspecified site of unspecified female breast: Secondary | ICD-10-CM

## 2022-02-09 DIAGNOSIS — K219 Gastro-esophageal reflux disease without esophagitis: Secondary | ICD-10-CM

## 2022-02-09 DIAGNOSIS — J45909 Unspecified asthma, uncomplicated: Secondary | ICD-10-CM

## 2022-02-09 DIAGNOSIS — C50411 Malignant neoplasm of upper-outer quadrant of right female breast: Secondary | ICD-10-CM

## 2022-02-09 NOTE — Progress Notes
Weekly Management Progress Note    Date: 02/09/2022    Kristy Martinez is a 61 y.o. female.     Vitals:  Vitals:    02/09/22 1312   BP: 139/80   BP Source: Arm, Left Upper   Pulse: 96   Temp: 36.8 ?C (98.2 ?F)   Resp: 16   SpO2: 98%   TempSrc: Temporal   PainSc: Zero   Weight: 79.9 kg (176 lb 3.2 oz)     Subjective   The encounter diagnosis was Malignant neoplasm of upper-outer quadrant of right breast in female, estrogen receptor positive ([facility]).  Staging:  Cancer Staging   Ductal carcinoma in situ (DCIS) of right breast  Staging form: Breast, AJCC 8th Edition  - Clinical stage from 08/17/2021: Stage 0 (cTis (DCIS), cN0, cM0, ER+, PR+, HER2: Not Assessed) - Signed by Scarlett Presto, PA-C on 08/17/2021  - Pathologic stage from 09/05/2021: Stage IA (pT1b, pN0, cM0, G2, ER-, PR-, HER2+) - Signed by Scarlett Presto, PA-C on 09/21/2021      Body mass index is 31.22 kg/m?Marland Kitchen  Pain Score: Zero     Fatigue Scale: 0-None    Treatment Data Summary:      Treatment Data 02/09/2022   Course ID C1 RtBreast 23   Plan ID [id number]   Prescription Dose (cGy) 4,256   Prescribed Dose per Fraction (Gy) 2.66   Fractions Treated to Date 1   Total Fractions on Plan 16   Treatment Elapsed Days 0   Reference Point ID RtBreast   Dosage Given to Date (Gy) 2.66        Subjective: Kristy Martinez is seen today for OTV.  She received 1 of 16 treatments to the right breast for her early breast cancer.  No complaints or concerns.  Using Aquaphor on her skin.     Objective: Vital signs are stable.  Clinical exam unchanged.    Imaging: Imaging is reviewed, isocenter alignment verified.     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  She status post breast conserving surgery without axillary lymph node sampling on 08/29/2021.  She was found to have invasive ductal carcinoma ER less than 1% PR 0%, HER2 3+.  On 09/19/2021 she had right axillary sentinel lymph node biopsy which confirmed a 0 of 3 sentinel nodes containing metastatic disease.  She received her final chemotherapy on 01/11/2022.  She will continue to complete 1 year of Herceptin.  Will benefit from adjuvant radiation therapy to complete breast conserving treatment.      Plan: Discussed skin care and radiation therapy.  Continue radiation as planned.    I am grateful for the opportunity to participate in the care of Kristy Martinez.    Please do not hesitate contact me if there are questions regarding today's visit.    Elby Showers, MD      Parts of this note were created using voice recognition software.  Please excuse any grammatical or typographical errors.

## 2022-02-10 ENCOUNTER — Encounter: Admit: 2022-02-10 | Discharge: 2022-02-10 | Payer: BC Managed Care – PPO

## 2022-02-10 ENCOUNTER — Ambulatory Visit: Admit: 2022-02-10 | Discharge: 2022-02-10 | Payer: BC Managed Care – PPO

## 2022-02-13 ENCOUNTER — Ambulatory Visit: Admit: 2022-02-13 | Discharge: 2022-02-13 | Payer: BC Managed Care – PPO

## 2022-02-13 ENCOUNTER — Encounter: Admit: 2022-02-13 | Discharge: 2022-02-13 | Payer: BC Managed Care – PPO

## 2022-02-14 ENCOUNTER — Encounter: Admit: 2022-02-14 | Discharge: 2022-02-14 | Payer: BC Managed Care – PPO

## 2022-02-14 ENCOUNTER — Ambulatory Visit: Admit: 2022-02-14 | Discharge: 2022-02-14 | Payer: BC Managed Care – PPO

## 2022-02-15 ENCOUNTER — Encounter: Admit: 2022-02-15 | Discharge: 2022-02-15 | Payer: BC Managed Care – PPO

## 2022-02-15 ENCOUNTER — Ambulatory Visit: Admit: 2022-02-15 | Discharge: 2022-02-15 | Payer: BC Managed Care – PPO

## 2022-02-16 ENCOUNTER — Encounter: Admit: 2022-02-16 | Discharge: 2022-02-16 | Payer: BC Managed Care – PPO

## 2022-02-16 ENCOUNTER — Ambulatory Visit: Admit: 2022-02-16 | Discharge: 2022-02-16 | Payer: BC Managed Care – PPO

## 2022-02-16 DIAGNOSIS — J45909 Unspecified asthma, uncomplicated: Secondary | ICD-10-CM

## 2022-02-16 DIAGNOSIS — C50411 Malignant neoplasm of upper-outer quadrant of right female breast: Secondary | ICD-10-CM

## 2022-02-16 DIAGNOSIS — C50919 Malignant neoplasm of unspecified site of unspecified female breast: Secondary | ICD-10-CM

## 2022-02-16 DIAGNOSIS — D0511 Intraductal carcinoma in situ of right breast: Secondary | ICD-10-CM

## 2022-02-16 DIAGNOSIS — K219 Gastro-esophageal reflux disease without esophagitis: Secondary | ICD-10-CM

## 2022-02-16 NOTE — Progress Notes
Weekly Management Progress Note    Date: 02/16/2022    Alahna Dunne is a 61 y.o. female.     Vitals:  Vitals:    02/16/22 1510   BP: 134/84   BP Source: Arm, Left Upper   Pulse: 91   Temp: 36.3 ?C (97.4 ?F)   Resp: 18   SpO2: 95%   TempSrc: Temporal   PainSc: Zero   Weight: 79.2 kg (174 lb 9.6 oz)     Subjective   The encounter diagnosis was Malignant neoplasm of upper-outer quadrant of right breast in female, estrogen receptor positive (HCC).  Staging:  Cancer Staging   Ductal carcinoma in situ (DCIS) of right breast  Staging form: Breast, AJCC 8th Edition  - Clinical stage from 08/17/2021: Stage 0 (cTis (DCIS), cN0, cM0, ER+, PR+, HER2: Not Assessed) - Signed by Scarlett Presto, PA-C on 08/17/2021  - Pathologic stage from 09/05/2021: Stage IA (pT1b, pN0, cM0, G2, ER-, PR-, HER2+) - Signed by Scarlett Presto, PA-C on 09/21/2021      Body mass index is 30.94 kg/m?Marland Kitchen  Pain Score: Zero     Fatigue Scale: 2    Treatment Data Summary:      Treatment Data 02/09/2022 02/10/2022 02/13/2022 02/14/2022 02/15/2022 02/16/2022   Course ID C1 RtBreast 23 C1 RtBreast 23 C1 RtBreast 23 C1 RtBreast 23 C1 RtBreast 23 C1 RtBreast 23   Plan ID RtBreastFiF RtBreastFiF RtBreastFiF RtBreastFiF RtBreastFiF RtBreastFiF   Prescription Dose (cGy) 4,256 4,256 4,256 4,256 4,256 4,256   Prescribed Dose per Fraction (Gy) 2.66 2.66 2.66 2.66 2.66 2.66   Fractions Treated to Date 1 2 3 4 5 6    Total Fractions on Plan 16 16 16 16 16 16    Treatment Elapsed Days 0 1 4 5 6 7    Reference Point ID RtBreast RtBreast RtBreast RtBreast RtBreast RtBreast   Dosage Given to Date (Gy) 2.66 5.32 7.98 10.64 13.3 15.96        Subjective: Ms. Seidenberg is seen today for OTV.  She received 6 of 16 treatments to the right breast for her early breast cancer.  No complaints or concerns.  Using Aquaphor on her skin.     Objective: Vital signs are stable.  Clinical exam unchanged.    Imaging: Imaging is reviewed, isocenter alignment verified.     Assessment: Mrs. 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  She status post breast conserving surgery without axillary lymph node sampling on 08/29/2021.  She was found to have invasive ductal carcinoma ER less than 1% PR 0%, HER2 3+.  On 09/19/2021 she had right axillary sentinel lymph node biopsy which confirmed a 0 of 3 sentinel nodes containing metastatic disease.  She received her final chemotherapy on 01/11/2022.  She will continue to complete 1 year of Herceptin.  Will benefit from adjuvant radiation therapy to complete breast conserving treatment.      Plan: Discussed skin care and radiation therapy.  Continue radiation as planned.    I am grateful for the opportunity to participate in the care of Mrs. Tendler.    Please do not hesitate contact me if there are questions regarding today's visit.    Elby Showers, MD      Parts of this note were created using voice recognition software.  Please excuse any grammatical or typographical errors.

## 2022-02-17 ENCOUNTER — Ambulatory Visit: Admit: 2022-02-17 | Discharge: 2022-02-17 | Payer: BC Managed Care – PPO

## 2022-02-17 ENCOUNTER — Encounter: Admit: 2022-02-17 | Discharge: 2022-02-17 | Payer: BC Managed Care – PPO

## 2022-02-20 ENCOUNTER — Encounter: Admit: 2022-02-20 | Discharge: 2022-02-20 | Payer: BC Managed Care – PPO

## 2022-02-20 ENCOUNTER — Ambulatory Visit: Admit: 2022-02-20 | Discharge: 2022-02-20 | Payer: BC Managed Care – PPO

## 2022-02-21 ENCOUNTER — Ambulatory Visit: Admit: 2022-02-21 | Discharge: 2022-02-21 | Payer: BC Managed Care – PPO

## 2022-02-21 ENCOUNTER — Encounter: Admit: 2022-02-21 | Discharge: 2022-02-21 | Payer: BC Managed Care – PPO

## 2022-02-22 ENCOUNTER — Encounter: Admit: 2022-02-22 | Discharge: 2022-02-22 | Payer: BC Managed Care – PPO

## 2022-02-22 ENCOUNTER — Ambulatory Visit: Admit: 2022-02-22 | Discharge: 2022-02-22 | Payer: BC Managed Care – PPO

## 2022-02-23 ENCOUNTER — Ambulatory Visit: Admit: 2022-02-23 | Discharge: 2022-02-23 | Payer: BC Managed Care – PPO

## 2022-02-23 ENCOUNTER — Encounter: Admit: 2022-02-23 | Discharge: 2022-02-23 | Payer: BC Managed Care – PPO

## 2022-02-23 DIAGNOSIS — C50411 Malignant neoplasm of upper-outer quadrant of right female breast: Secondary | ICD-10-CM

## 2022-02-23 DIAGNOSIS — D0511 Intraductal carcinoma in situ of right breast: Secondary | ICD-10-CM

## 2022-02-23 DIAGNOSIS — C50919 Malignant neoplasm of unspecified site of unspecified female breast: Secondary | ICD-10-CM

## 2022-02-23 DIAGNOSIS — K219 Gastro-esophageal reflux disease without esophagitis: Secondary | ICD-10-CM

## 2022-02-23 DIAGNOSIS — J45909 Unspecified asthma, uncomplicated: Secondary | ICD-10-CM

## 2022-02-23 NOTE — Progress Notes
Weekly Management Progress Note    Date: 02/23/2022    Arvetta Araque is a 61 y.o. female.     Vitals:  There were no vitals filed for this visit.  Subjective   The encounter diagnosis was Malignant neoplasm of upper-outer quadrant of right breast in female, estrogen receptor positive (HCC).  Staging:  Cancer Staging   Ductal carcinoma in situ (DCIS) of right breast  Staging form: Breast, AJCC 8th Edition  - Clinical stage from 08/17/2021: Stage 0 (cTis (DCIS), cN0, cM0, ER+, PR+, HER2: Not Assessed) - Signed by Scarlett Presto, PA-C on 08/17/2021  - Pathologic stage from 09/05/2021: Stage IA (pT1b, pN0, cM0, G2, ER-, PR-, HER2+) - Signed by Scarlett Presto, PA-C on 09/21/2021      There is no height or weight on file to calculate BMI.             Treatment Data Summary:      Treatment Data 02/15/2022 02/16/2022 02/17/2022 02/20/2022 02/21/2022 02/22/2022 02/23/2022   Course ID C1 RtBreast 23 C1 RtBreast 23 C1 RtBreast 23 C1 RtBreast 23 C1 RtBreast 23 C1 RtBreast 23 C1 RtBreast 23   Plan ID RtBreastFiF RtBreastFiF RtBreastFiF RtBreastFiF RtBreastFiF RtBreastFiF RtBreastFiF   Prescription Dose (cGy) 4,256 4,256 4,256 4,256 4,256 4,256 4,256   Prescribed Dose per Fraction (Gy) 2.66 2.66 2.66 2.66 2.66 2.66 2.66   Fractions Treated to Date 5 6 7 8 9 10 11    Total Fractions on Plan 16 16 16 16 16 16 16    Treatment Elapsed Days 6 7 8 11 12 13 14    Reference Point ID RtBreast RtBreast RtBreast RtBreast RtBreast RtBreast RtBreast   Dosage Given to Date (Gy) 13.3 15.96 18.62 21.28 23.94 26.6 29.26        Subjective: Ms. Redditt is seen today for OTV.  She received 11 of 16 treatments to the right breast for her early breast cancer.  No complaints or concerns.  Using Aquaphor on her skin.     Objective: Vital signs are stable.  Increased hyperpigmentation of the skin in the area of treatment without peeling or desquamation or severe erythema noted.    Imaging: Imaging is reviewed, isocenter alignment verified.     Assessment: Mrs. 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  She status post breast conserving surgery without axillary lymph node sampling on 08/29/2021.  She was found to have invasive ductal carcinoma ER less than 1% PR 0%, HER2 3+.  On 09/19/2021 she had right axillary sentinel lymph node biopsy which confirmed a 0 of 3 sentinel nodes containing metastatic disease.  She received her final chemotherapy on 01/11/2022.  She will continue to complete 1 year of Herceptin.  Will benefit from adjuvant radiation therapy to complete breast conserving treatment.      Plan: Reviewed skin care and radiation therapy.  Continue radiation as planned.    I am grateful for the opportunity to participate in the care of Mrs. Hudock.    Please do not hesitate contact me if there are questions regarding today's visit.    Elby Showers, MD      Parts of this note were created using voice recognition software.  Please excuse any grammatical or typographical errors.

## 2022-02-24 ENCOUNTER — Encounter: Admit: 2022-02-24 | Discharge: 2022-02-24 | Payer: BC Managed Care – PPO

## 2022-02-24 ENCOUNTER — Ambulatory Visit: Admit: 2022-02-24 | Discharge: 2022-02-24 | Payer: BC Managed Care – PPO

## 2022-02-27 ENCOUNTER — Ambulatory Visit: Admit: 2022-02-27 | Discharge: 2022-02-27 | Payer: BC Managed Care – PPO

## 2022-02-27 ENCOUNTER — Encounter: Admit: 2022-02-27 | Discharge: 2022-02-27 | Payer: BC Managed Care – PPO

## 2022-02-28 ENCOUNTER — Ambulatory Visit: Admit: 2022-02-28 | Discharge: 2022-02-28 | Payer: BC Managed Care – PPO

## 2022-02-28 ENCOUNTER — Encounter: Admit: 2022-02-28 | Discharge: 2022-02-28 | Payer: BC Managed Care – PPO

## 2022-03-01 ENCOUNTER — Ambulatory Visit: Admit: 2022-03-01 | Discharge: 2022-03-01 | Payer: BC Managed Care – PPO

## 2022-03-01 ENCOUNTER — Encounter: Admit: 2022-03-01 | Discharge: 2022-03-01 | Payer: BC Managed Care – PPO

## 2022-03-01 DIAGNOSIS — C50411 Malignant neoplasm of upper-outer quadrant of right female breast: Secondary | ICD-10-CM

## 2022-03-01 MED ORDER — TRASTUZUMAB-ANNS IVPB
6 mg/kg | Freq: Once | INTRAVENOUS | 0 refills | Status: CP
Start: 2022-03-01 — End: ?
  Administered 2022-03-01 (×2): 466.2 mg via INTRAVENOUS

## 2022-03-01 NOTE — Progress Notes
CHEMO NOTE  Verified chemo consent signed and in chart.    Blood return positive via: Port (Power Port)    BSA and dose double checked (agree with orders as written) with: yes, see MAR    Labs/applicable tests checked: Echocardiogram    Chemo regimen: Drug/cycle/day C7D1 Herceptin    trastuzumab-anns (KANJINTI) 466.2 mg in sodium chloride 0.9% (NS) 272.2 mL IVPB    Rate verified and armband double check with second RN: yes, see MAR    Patient education offered and stated understanding. Denies questions at this time.      Patient arrived to CC treatment . VSS, port positive for blood return, and assessment complete; please refer to doc flowsheet for details. Premeds given per treatment plan. Kanjinti given w/o complications, patient tolerated well. Port positive for blood return, flushed with saline, and deaccessed per protocol. Patient declined copy of labs and AVS. All questions and concerns addressed. Patient left CC treatment w/ all personal belongings in stable condition.

## 2022-03-02 ENCOUNTER — Encounter: Admit: 2022-03-02 | Discharge: 2022-03-02 | Payer: BC Managed Care – PPO

## 2022-03-02 ENCOUNTER — Ambulatory Visit: Admit: 2022-03-02 | Discharge: 2022-03-02 | Payer: BC Managed Care – PPO

## 2022-03-02 DIAGNOSIS — C50411 Malignant neoplasm of upper-outer quadrant of right female breast: Secondary | ICD-10-CM

## 2022-03-02 DIAGNOSIS — C50919 Malignant neoplasm of unspecified site of unspecified female breast: Secondary | ICD-10-CM

## 2022-03-02 DIAGNOSIS — K219 Gastro-esophageal reflux disease without esophagitis: Secondary | ICD-10-CM

## 2022-03-02 DIAGNOSIS — D0511 Intraductal carcinoma in situ of right breast: Secondary | ICD-10-CM

## 2022-03-02 DIAGNOSIS — J45909 Unspecified asthma, uncomplicated: Secondary | ICD-10-CM

## 2022-03-02 NOTE — Progress Notes
Weekly Management Progress Note    Date: 03/02/2022    Kristy Martinez is a 61 y.o. female.     Vitals:  Vitals:    03/02/22 1532   BP: (!) 142/77   Pulse: 108   Temp: 36.5 ?C (97.7 ?F)   Resp: 20   SpO2: 95%   PainSc: Zero   Weight: 78.7 kg (173 lb 9.6 oz)     Subjective   The encounter diagnosis was Malignant neoplasm of upper-outer quadrant of right breast in female, estrogen receptor positive (HCC).  Staging:  Cancer Staging   Ductal carcinoma in situ (DCIS) of right breast  Staging form: Breast, AJCC 8th Edition  - Clinical stage from 08/17/2021: Stage 0 (cTis (DCIS), cN0, cM0, ER+, PR+, HER2: Not Assessed) - Signed by Scarlett Presto, PA-C on 08/17/2021  - Pathologic stage from 09/05/2021: Stage IA (pT1b, pN0, cM0, G2, ER-, PR-, HER2+) - Signed by Scarlett Presto, PA-C on 09/21/2021      Body mass index is 30.76 kg/m?Marland Kitchen  Pain Score: Zero          Treatment Data Summary:      Treatment Data 02/22/2022 02/23/2022 02/24/2022 02/27/2022 02/28/2022 03/01/2022 03/02/2022   Course ID C1 RtBreast 23 C1 RtBreast 23 C1 RtBreast 23 C1 RtBreast 23 C1 RtBreast 23 C1 RtBreast 23 C1 RtBreast 23   Plan ID RtBreastFiF RtBreastFiF RtBreastFiF RtBreastFiF RtBreastFiF RtBreastFiF RtBreastFiF   Prescription Dose (cGy) 4,256 4,256 4,256 4,256 4,256 4,256 4,256   Prescribed Dose per Fraction (Gy) 2.66 2.66 2.66 2.66 2.66 2.66 2.66   Fractions Treated to Date 10 11 12 13 14 15 16    Total Fractions on Plan 16 16 16 16 16 16 16    Treatment Elapsed Days 13 14 15 18 19 20 21    Reference Point ID RtBreast RtBreast RtBreast RtBreast RtBreast RtBreast RtBreast   Dosage Given to Date (Gy) 26.6 29.26 31.92 34.58 37.24 39.9 42.56        Subjective: Ms. Celia is seen today for OTV.  She has completed 16/16 treatments to the right breast for her early breast cancer.  No complaints or concerns.  Using Aquaphor on her skin.     Objective: Vital signs are stable.  Stable hyperpigmentation of the skin in the area of treatment without peeling or desquamation or severe erythema noted.    Imaging: Imaging is reviewed, isocenter alignment verified.     Assessment: Mrs. 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  She status post breast conserving surgery without axillary lymph node sampling on 08/29/2021.  She was found to have invasive ductal carcinoma ER less than 1% PR 0%, HER2 3+.  On 09/19/2021 she had right axillary sentinel lymph node biopsy which confirmed a 0 of 3 sentinel nodes containing metastatic disease.  She received her final chemotherapy on 01/11/2022.  She will continue to complete 1 year of Herceptin.  Will benefit from adjuvant radiation therapy to complete breast conserving treatment.      Plan: Reviewed skin care and radiation therapy. She has completed radiation. Discussed plans for follow up.    I am grateful for the opportunity to participate in the care of Mrs. Cervi.    Please do not hesitate contact me if there are questions regarding today's visit.    Elby Showers, MD      Parts of this note were created  using voice recognition software.  Please excuse any grammatical or typographical errors.

## 2022-03-02 NOTE — Progress Notes
End of Treatment Summary Note    Date:  03/02/2022    Kristy Martinez is a 61 y.o. female.     The encounter diagnosis was Malignant neoplasm of upper-outer quadrant of right breast in female, estrogen receptor positive (HCC).  Staging:  Cancer Staging   Ductal carcinoma in situ (DCIS) of right breast  Staging form: Breast, AJCC 8th Edition  - Clinical stage from 08/17/2021: Stage 0 (cTis (DCIS), cN0, cM0, ER+, PR+, HER2: Not Assessed) - Signed by Scarlett Presto, PA-C on 08/17/2021  - Pathologic stage from 09/05/2021: Stage IA (pT1b, pN0, cM0, G2, ER-, PR-, HER2+) - Signed by Scarlett Presto, PA-C on 09/21/2021      DIAGNOSIS: Intermediate to high grade DCIS of the RIGHT breast, 10:00, cribriform and solid type with calcifications and comedonecrosis. ER 70%, PR 10%. Diagnosed 07/2021  ?  History of Present Illness:   Kristy Martinez is a 61 y.o. female  I personally reviewed her previous medical records including imaging, pathology, and other provider records, to obtain some of this information. Her oncologic history is briefly as follows:  ?  -The patient is a lovely 61 year old female who reports that she did not have any adverse findings or breast concerns prior to presenting for screening mammogram.  - 07/22/2021 ([place]) bilateral screening mammogram: No suspicious abnormality in the left breast.  In the upper outer right breast, middle posterior depth there is a focal asymmetry with associated microcalcifications.  - 08/08/2021 ([place]) right diagnostic mammogram: Persistent 1.5 cm focal asymmetry with subtle architectural distortion in the upper outer right breast middle posterior depth at approximately 10:00, with associated fine pleomorphic calcifications.  The calcifications in aggregate measure approximately 2.0 x 1.5 x 2.3 cm.  - 08/08/2021 ([place]) targeted right breast ultrasound: No convincing ultrasound correlate identified for mammographic asymmetry of concern.  5 morphologically normal-appearing right axillary lymph nodes.  Suspicious focal asymmetry with associated calcifications upper outer right breast 10:00, biopsy recommended.  - 08/11/2021 ([place]) biopsy right breast upper outer quadrant 10:00: ER positive, PR positive, intermediate to high nuclear grade ductal carcinoma in situ.    Treatment Data Summary:   3D Therapy2D/3D Therapy Types: Photon   Treatment Data 02/22/2022 02/23/2022 02/24/2022 02/27/2022 02/28/2022 03/01/2022 03/02/2022   Course ID C1 RtBreast 23 C1 RtBreast 23 C1 RtBreast 23 C1 RtBreast 23 C1 RtBreast 23 C1 RtBreast 23 C1 RtBreast 23   Plan ID RtBreastFiF RtBreastFiF RtBreastFiF RtBreastFiF RtBreastFiF RtBreastFiF RtBreastFiF   Prescription Dose (cGy) 4,256 4,256 4,256 4,256 4,256 4,256 4,256   Prescribed Dose per Fraction (Gy) 2.66 2.66 2.66 2.66 2.66 2.66 2.66   Fractions Treated to Date 10 11 12 13 14 15 16    Total Fractions on Plan 16 16 16 16 16 16 16    Treatment Elapsed Days 13 14 15 18 19 20 21    Reference Point ID RtBreast RtBreast RtBreast RtBreast RtBreast RtBreast RtBreast   Dosage Given to Date 26.6 29.26 31.92 34.58 37.24 39.9 42.56   Some recent data might be hidden        Number of missed treatment days: none     Treatment Tolerance and Response:  The patient tolerated treatment well without unexpected or significant acute side effects.        Follow Up:  Berenize Gatlin will be scheduled for a follow up with Dr. Isabella Stalling at 4-6 weeks.

## 2022-03-07 ENCOUNTER — Encounter: Admit: 2022-03-07 | Discharge: 2022-03-07 | Payer: BC Managed Care – PPO

## 2022-03-13 ENCOUNTER — Encounter: Admit: 2022-03-13 | Discharge: 2022-03-13 | Payer: BC Managed Care – PPO

## 2022-03-13 MED ORDER — SILVER SULFADIAZINE 1 % TP CREA
TOPICAL | 3 refills | 10.00000 days | Status: AC
Start: 2022-03-13 — End: ?

## 2022-03-16 NOTE — Progress Notes
Radiation Oncology Follow Up Note  Date: 04/05/2022       Kristy Martinez is a 61 y.o. female.     There were no encounter diagnoses.  Staging:  Cancer Staging   Ductal carcinoma in situ (DCIS) of right breast  Staging form: Breast, AJCC 8th Edition  - Clinical stage from 08/17/2021: Stage 0 (cTis (DCIS), cN0, cM0, ER+, PR+, HER2: Not Assessed) - Signed by Scarlett Presto, PA-C on 08/17/2021  - Pathologic stage from 09/05/2021: Stage IA (pT1b, pN0, cM0, G2, ER-, PR-, HER2+) - Signed by Scarlett Presto, PA-C on 09/21/2021      History of Present Illness  [name]. Kristy Martinez is a 61 y.o. female with oncologic history as follows:    - 07/22/2021 ([place]) bilateral screening mammogram: No suspicious abnormality in the left breast. ?In the upper outer right breast, middle posterior depth there is a focal asymmetry with associated microcalcifications.  -?08/08/2021 ([place]) right diagnostic mammogram: Persistent 1.5 cm focal asymmetry with subtle architectural distortion in the upper outer right breast middle posterior depth at approximately 10:00,?with associated fine pleomorphic calcifications. ?The calcifications in aggregate measure approximately 2.0 x 1.5 x 2.3 cm.  -?08/08/2021 ([place]) targeted right breast ultrasound: No convincing ultrasound correlate identified for mammographic asymmetry of concern. ?5 morphologically normal-appearing right axillary lymph nodes. ?Suspicious focal asymmetry with associated calcifications upper outer right breast 10:00, biopsy recommended.  -?08/11/2021 ([place]) biopsy right breast upper outer quadrant 10:00: ER positive, PR positive, intermediate to high nuclear grade ductal carcinoma in situ.  -08/29/2021 right radioactive seed localized lumpectomy.  The final pathology showing a small focus of invasive ductal carcinoma, grade 2 with extensive intraductal component, high nuclear grade.  ER less than 1%, PR 0, HER2 3+.  -On 09/19/2021 the patient underwent right axillary sentinel node biopsy.  Final pathology confirmed 0 of 3 lymph nodes to be involved with metastatic carcinoma.    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 ER 70%, PR 10%. Diagnosed 07/2021    Treatment History:   Patient underwent adjuvant radiotherapy to her right breast to a dose of 4256 cGy in 16 fractions from 02/09/2022 - 03/02/2022. She tolerated treatment well and had no treatment interruptions.      03/02/2022   Course ID C1 [id number] [id number]   First Treatment Date 02-09-2022 02:10PM   Last Treatment Date 03-02-2022 03:24PM   Treatment Elapsed Days 21   Reference Point ID RtBreast   Dosage Given To Date 42.56   Session Dosage Given 2.66   Plan ID [id number]   Fractions Treated to Date 16   Total Fractions on Plan 16   Prescribed Dose per Fraction 2.66   Prescription Dose 4,256     Subjective:       [name] returns today for routine follow-up. She is unaccompanied to today's visit. She is overall feeling well, feels she is recovering well from radiation therapy. She notes that about 4-5 days after completing radiation she developed more skin reaction with erythema/rash particularly on the lateral aspect of her breast and under her arm. She was prescribed SSD which she feels helps. She still has some residual erythema around her lymph node bx incision but otherwise feels this has resolved. She does note hyperpigmentation of the treated breast. She denies pain or tenderness of her breast. No swelling of her arms. Range of motion in right arm/shoulder is excellent. She did not experience sore  throat or dysphagia with treatment. She did develop a cough a couple of weeks after completing radiation but this resolved after a course of prednisone and resuming her previous asthma medications. She denies shortness of air or fever/chills. Patient denies neurologic symptoms such as headache, dizziness or vision changes. She does have some fatigue, she feels it is improving some with more good days than bad.         Review of Systems   Constitutional: Positive for fatigue. Negative for fever.   HENT: Negative for trouble swallowing.    Respiratory: Negative for cough and shortness of breath.    Cardiovascular: Negative for chest pain.   Skin: Positive for color change (darkening of treated breast).   Neurological: Negative.    All other systems reviewed and are negative.      Objective:         ? acetaminophen (TYLENOL) 325 mg tablet Take two tablets by mouth every 6 hours as needed for Pain. Indications: take two tabs every 6 hours for the first three days after surgery, then take as needed   ? cetirizine HCl (ZYRTEC PO) Take  by mouth.   ? citalopram (CELEXA) 20 mg tablet Take one tablet by mouth daily.   ? FLOVENT HFA 110 mcg/actuation inhaler    ? lidocaine/prilocaine (EMLA) 2.5/2.5 % topical cream Apply a nickel size of the cream to the center of the port, 30 minutes prior to port access. Cover the site with non adhesive bandage.   ? LORazepam (ATIVAN) 0.5 mg tablet Take one tablet by mouth every 8 hours as needed for Nausea, Vomiting or Other... (insomnia). Max Daily Amount: 1.5 mg.   ? omeprazole DR (PRILOSEC) 20 mg capsule    ? pantoprazole DR (PROTONIX) 40 mg tablet    ? prochlorperazine maleate (COMPAZINE) 10 mg tablet Take one tablet by mouth every 6 hours as needed.   ? silver sulfADIAZINE (SSD) 1 % topical cream Apply to affected skin BID.     Vitals:    04/05/22 1453   BP: 135/75   BP Source: Arm, Left Upper   Pulse: 93   Temp: 36.8 ?C (98.2 ?F)   Resp: 18   SpO2: 100%   TempSrc: Temporal   PainSc: Zero   Weight: 79.5 kg (175 lb 3.2 oz)     Body mass index is 31.04 kg/m?Marland Kitchen     Pain Score: Zero        Fatigue Scale: 0-None    KARNOFSKY PERFORMANCE SCORE:  90% Able to carry on normal activity; minor signs of disease     Physical Exam  Vitals reviewed.   Constitutional:       General: She is not in acute distress.     Appearance: Normal appearance. She is well-developed. She is not ill-appearing.   HENT:      Head: Normocephalic and atraumatic.   Eyes:      General: Lids are normal.      Extraocular Movements: Extraocular movements intact.      Conjunctiva/sclera: Conjunctivae normal.   Cardiovascular:      Rate and Rhythm: Normal rate and regular rhythm.      Heart sounds: Normal heart sounds.   Pulmonary:      Effort: Pulmonary effort is normal.      Breath sounds: Normal breath sounds.   Chest:   Breasts:     Right: Swelling and skin change present.      Left: Normal.      Comments:  Hyperpigmentation of right breast with mild dependent edema. Well-healed surgical incision with mild erythema surrouding SLNB incision in right axilla. No suspicious abnormalities in bilateral breasts.  No skin, nipple, or areolar changes left breast.      Musculoskeletal:      Cervical back: Neck supple.   Lymphadenopathy:      Cervical: No cervical adenopathy.      Upper Body:      Right upper body: No supraclavicular or axillary adenopathy.      Left upper body: No supraclavicular or axillary adenopathy.   Skin:     General: Skin is warm and dry.      Coloration: Skin is not pale.   Neurological:      Mental Status: She is alert and oriented to person, place, and time. Mental status is at baseline.      Cranial Nerves: Cranial nerves 2-12 are intact.      Sensory: Sensation is intact.      Motor: Motor function is intact.   Psychiatric:         Attention and Perception: Attention normal.         Mood and Affect: Mood and affect normal.         Speech: Speech normal.         Behavior: Behavior normal.         Cognition and Memory: Cognition and memory normal.            Laboratory:    Comprehensive Metabolic Profile    Lab Results   Component Value Date/Time    NA 140 01/11/2022 01:31 PM    K 3.6 01/11/2022 01:31 PM    CL 104 01/11/2022 01:31 PM    CO2 31 (H) 01/11/2022 01:31 PM    GAP 5 01/11/2022 01:31 PM    BUN 13 01/11/2022 01:31 PM    CR 0.72 01/11/2022 01:31 PM    GLU 136 (H) 01/11/2022 01:31 PM    Lab Results   Component Value Date/Time    CA 8.6 01/11/2022 01:31 PM    ALBUMIN 3.8 01/11/2022 01:31 PM    TOTPROT 6.3 01/11/2022 01:31 PM    ALKPHOS 64 01/11/2022 01:31 PM    AST 16 01/11/2022 01:31 PM    ALT 18 01/11/2022 01:31 PM    TOTBILI 0.3 01/11/2022 01:31 PM        CBC w diff    Lab Results   Component Value Date/Time    WBC 5.7 01/11/2022 01:31 PM    RBC 3.61 (L) 01/11/2022 01:31 PM    HGB 11.3 (L) 01/11/2022 01:31 PM    HCT 33.6 (L) 01/11/2022 01:31 PM    MCV 93.1 01/11/2022 01:31 PM    MCH 31.4 01/11/2022 01:31 PM    MCHC 33.7 01/11/2022 01:31 PM    RDW 16.9 (H) 01/11/2022 01:31 PM    PLTCT 360 01/11/2022 01:31 PM    MPV 7.3 01/11/2022 01:31 PM    Lab Results   Component Value Date/Time    NEUT 66 01/11/2022 01:31 PM    ANC 3.80 01/11/2022 01:31 PM    LYMA 23 (L) 01/11/2022 01:31 PM    ALC 1.30 01/11/2022 01:31 PM    MONA 6 01/11/2022 01:31 PM    AMC 0.30 01/11/2022 01:31 PM    EOSA 4 01/11/2022 01:31 PM    AEC 0.20 01/11/2022 01:31 PM    BASA 1 01/11/2022 01:31 PM    ABC 0.10 01/11/2022 01:31 PM  Imaging:   2D + DOPPLER ECHO  ? Preserved LV systolic function, EF estimated 55 to 60%.  Global   functional strain is -20%) within normal limits).  ? Normal LV cavity size.  ? No regional wall motion abnormalities.  ? Normal bilateral atria.  ? No clinically significant valve disease detected.  ? CVP estimated 3 mmHg.  Peak PA systolic pressure estimated 24 mmHg.  ? No pericardial effusion.    Compared to a prior November 2022 study.  Continue demonstration of   preserved LV systolic function.  The global logical strain remains   preserved previously assessed as -23% now currently -20%.  No interval   development of regional wall motion abnormality or clinically significant   valve disease.  Overall no significant changes.       Path:  PATHOLOGY REPORT   Date Value Ref Range Status   09/19/2021   Final    [facility]  [email or web address]    Department of Pathology and Laboratory Medicine  [address] [place], [place] [address]  Surgical Pathology Office:  [phone]  Fax:  [phone]  SURGICAL PATHOLOGY REPORT    NAME: Kristy Martinez, Kristy Martinez SURG PATH #: [id number] MR #: [id number] SPECIMEN  CLASS: SI BILLING #: [id number] ALT ID #:  LOCATION: [id number] DATE OF  PROCEDURE: 09/19/2021 AGE:  60 SEX: F DATE RECEIVED: 09/19/2021 DOB:  08-12-1961  TIME RECEIVED:  11:02 PHYSICIAN: Ignatius Specking, MD DATE OF  REPORT: 09/21/2021 COPY TO:  DATE OF PRINTING: 09/21/2021         ########################################################################  Final Diagnosis:    A. Lymph nodes, right axillary sentinel lymph nodes, biopsy:    Three lymph nodes, negative for metastatic carcinoma (0/3).  Deeper sections and pancytokeratin staining support the absence of  carcinoma.       Attestation:  By this signature, I attest that I have personally formulated the final  interpretation expressed in this report and that the above diagnosis is  based upon my examination of the slides and/or other material indicated in  this report.    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         Gross Description:  A. Received fresh, labeled with the patient's name and right axillary  sentinel lymph nodes, are three tan-yellow lymph nodes measuring 0.7-1.2  cm in greatest dimension. The specimen is entirely submitted as follows:  A1      2 possible lymph nodes, bisected (inked black and blue).   A2      1 possible lymph node, serially sectioned.   A3      Remainder of fibroadipose tissue with possible minute lymph nodes.  (ket)    kp/09/19/2021           If immunohistochemical stains and/or in situ hybridization are cited in  this report, the performance characteristics were determined by the  Department of Pathology and Laboratory Medicine of the [facility]  [facility] Association) in compliance with CLIA'88  regulations. Some of these tests rely on the use of analyte specific  reagents and are subject to specific labeling requirements by the FDA.  The  stains are performed on formalin-fixed, paraffin-embedded tissue,  unless otherwise stated. Known positive and negative control tissues  demonstrate appropriate staining. Results should be interpreted with  caution given the likelihood of false negativity on decalcified specimens.  This testing was developed by the Department of Pathology and Laboratory  Medicine of the [facility] [place]. It has not been cleared or approved  by the FDA.  The FDA has determined that such clearance or approval is not  necessary.    Professional services performed by [facility] [place], at [address] [place], [place]  [address]         Assessment and Plan:  [name] is a  60 y.o. 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  She status post breast conserving surgery without axillary lymph node sampling on 08/29/2021.  She was found to have invasive ductal carcinoma ER less than 1% PR 0%, HER2 3+.  On 09/19/2021 she had right axillary sentinel lymph node biopsy which confirmed a 0 of 3 sentinel nodes containing metastatic disease.  She received her final chemotherapy on 01/11/2022 and is planned to complete 1 year of Herceptin. She underwent adjuvant radiotherapy to her right breast to a dose of 4256 cGy in 16 fractions from 02/09/2022 - 03/02/2022.     - Kristy Martinez was seen today for initial follow-up after completion of radiation therapy.  At this time, she is clinically stable with no clinical evidence of disease recurrence or progression.  She has no subacute toxicities from having undergone adjuvant radiation, aside from hyperpigmentation of the skin and mild dependent breast edema.  - Our plan will be for the patient to return to our clinic in 6 months for continued follow-up.  She may contact us in the interim if there are any questions or concerns requiring earlier assessment.  -The patient will keep their appointments with their other managing providers including medical oncology, Dr. Welton Flakes, she continues on Herceptin; as well as with breast surgery with f/u and mammogram scheduled for August.                 Total time for today's visit was 28 minutes. Visit time spent on the following: preparing to see the patient, obtaining and/or reviewing separately obtained history/information, performing a physical examination and/or evaluation, counseling and educating the patient/family/caregiver, referring to and communication with other health care professionals, documenting clinical information in the electronic or other health record and care coordination.     Liston Alba AGNP-C  [facility] - Radiation Oncology  [address]  [place], [place] [address]    Collaborating Physician: Elby Showers, MD    Parts of this note were created using voice recognition software.  Please excuse any grammatical or typographical errors.

## 2022-03-22 ENCOUNTER — Encounter: Admit: 2022-03-22 | Discharge: 2022-03-22 | Payer: BC Managed Care – PPO

## 2022-03-22 DIAGNOSIS — J45909 Unspecified asthma, uncomplicated: Secondary | ICD-10-CM

## 2022-03-22 DIAGNOSIS — C50411 Malignant neoplasm of upper-outer quadrant of right female breast: Secondary | ICD-10-CM

## 2022-03-22 DIAGNOSIS — K219 Gastro-esophageal reflux disease without esophagitis: Secondary | ICD-10-CM

## 2022-03-22 DIAGNOSIS — Z79899 Other long term (current) drug therapy: Secondary | ICD-10-CM

## 2022-03-22 DIAGNOSIS — D0511 Intraductal carcinoma in situ of right breast: Secondary | ICD-10-CM

## 2022-03-22 DIAGNOSIS — C50919 Malignant neoplasm of unspecified site of unspecified female breast: Secondary | ICD-10-CM

## 2022-03-22 MED ORDER — TRASTUZUMAB-ANNS IVPB
6 mg/kg | Freq: Once | INTRAVENOUS | 0 refills | Status: CP
Start: 2022-03-22 — End: ?
  Administered 2022-03-22 (×2): 466.2 mg via INTRAVENOUS

## 2022-03-22 NOTE — Progress Notes
CHEMO NOTE  Verified chemo consent signed and in chart.    Blood return positive via: Port (Single, Power Port and Accessed)    BSA and dose double checked (agree with orders as written) with: yes Clayborn Heron, RN    Labs/applicable tests checked: Echocardiogram    Chemo regimen: C4D1    trastuzumab-anns (KANJINTI) 466.2 mg in sodium chloride 0.9% (NS) 272.2 mL IVPB     Rate verified and armband double check with second RN: yes    Patient education offered and stated understanding. Denies questions at this time.      Patient arrived to CC treatment. VSS, port accessed and positive for blood return, and assessment complete; please refer to doc flowsheet for details. Kanjinti given w/o complications, patient tolerated well. Port positive for blood return, flushed with saline, and deaccessed per protocol. Patient declined copy of labs and AVS. All questions and concerns addressed. Patient left CC treatment in stable condition.

## 2022-04-05 ENCOUNTER — Encounter: Admit: 2022-04-05 | Discharge: 2022-04-05 | Payer: BC Managed Care – PPO

## 2022-04-05 ENCOUNTER — Ambulatory Visit: Admit: 2022-04-05 | Discharge: 2022-04-05 | Payer: BC Managed Care – PPO

## 2022-04-05 DIAGNOSIS — C50919 Malignant neoplasm of unspecified site of unspecified female breast: Secondary | ICD-10-CM

## 2022-04-05 DIAGNOSIS — D0511 Intraductal carcinoma in situ of right breast: Secondary | ICD-10-CM

## 2022-04-05 DIAGNOSIS — J45909 Unspecified asthma, uncomplicated: Secondary | ICD-10-CM

## 2022-04-05 DIAGNOSIS — Z09 Encounter for follow-up examination after completed treatment for conditions other than malignant neoplasm: Secondary | ICD-10-CM

## 2022-04-05 DIAGNOSIS — K219 Gastro-esophageal reflux disease without esophagitis: Secondary | ICD-10-CM

## 2022-04-09 ENCOUNTER — Encounter: Admit: 2022-04-09 | Discharge: 2022-04-09 | Payer: BC Managed Care – PPO

## 2022-04-12 ENCOUNTER — Encounter: Admit: 2022-04-12 | Discharge: 2022-04-12 | Payer: BC Managed Care – PPO

## 2022-04-12 DIAGNOSIS — C50411 Malignant neoplasm of upper-outer quadrant of right female breast: Secondary | ICD-10-CM

## 2022-04-12 MED ORDER — TRASTUZUMAB-ANNS IVPB
6 mg/kg | Freq: Once | INTRAVENOUS | 0 refills | Status: CP
Start: 2022-04-12 — End: ?
  Administered 2022-04-12 (×2): 466.2 mg via INTRAVENOUS

## 2022-04-12 NOTE — Progress Notes
CHEMO NOTE  Verified chemo consent signed and in chart.    Blood return positive via: Port (Single)    BSA and dose double checked (agree with orders as written) with: yes, Leslye Peer, RN    Labs/applicable tests checked: Echocardiogram    Chemo regimen: C9D1 Herceptin    trastuzumab-anns (KANJINTI) 466.2 mg in sodium chloride 0.9% (NS) 272.2 mL IVPB      Rate verified and armband double check with second RN: yes    Patient education offered and stated understanding. Denies questions at this time.      Patient arrived to CC treatment. VSS, port positive for blood return, and assessment complete; please refer to doc flowsheet for details. Kanjinti given w/o complications, patient tolerated well. Port positive for blood return, flushed with saline, and deaccessed per protocol. Patient declined copy of labs and AVS. All questions and concerns addressed. Patient left CC treatment in stable condition.

## 2022-04-20 ENCOUNTER — Encounter: Admit: 2022-04-20 | Discharge: 2022-04-20 | Payer: BC Managed Care – PPO

## 2022-04-20 MED ORDER — LIDOCAINE-PRILOCAINE 2.5-2.5 % TP CREA
1 refills | Status: AC
Start: 2022-04-20 — End: ?

## 2022-04-24 ENCOUNTER — Encounter: Admit: 2022-04-24 | Discharge: 2022-04-24 | Payer: BC Managed Care – PPO

## 2022-04-24 MED ORDER — TRASTUZUMAB-ANNS IVPB
6 mg/kg | Freq: Once | INTRAVENOUS | 0 refills
Start: 2022-04-24 — End: ?

## 2022-05-01 ENCOUNTER — Encounter: Admit: 2022-05-01 | Discharge: 2022-05-01 | Payer: BC Managed Care – PPO

## 2022-05-01 ENCOUNTER — Ambulatory Visit: Admit: 2022-05-01 | Discharge: 2022-05-01 | Payer: BC Managed Care – PPO

## 2022-05-01 DIAGNOSIS — Z79899 Other long term (current) drug therapy: Secondary | ICD-10-CM

## 2022-05-03 ENCOUNTER — Encounter: Admit: 2022-05-03 | Discharge: 2022-05-03 | Payer: BC Managed Care – PPO

## 2022-05-03 DIAGNOSIS — C50411 Malignant neoplasm of upper-outer quadrant of right female breast: Secondary | ICD-10-CM

## 2022-05-03 MED ORDER — TRASTUZUMAB-ANNS IVPB
6 mg/kg | Freq: Once | INTRAVENOUS | 0 refills | Status: CP
Start: 2022-05-03 — End: ?
  Administered 2022-05-03 (×2): 466.2 mg via INTRAVENOUS

## 2022-05-03 NOTE — Progress Notes
CHEMO NOTE  Verified chemo consent signed and in chart.    Blood return positive via: Port (Single and Accessed)    BSA and dose double checked (agree with orders as written) with: yes     Labs/applicable tests checked: Echocardiogram    Chemo regimen: C6D1  trastuzumab-anns (KANJINTI) 466.2 mg in sodium chloride 0.9% (NS) 272.2 mL IVPB     Rate verified and armband double check with second RN: yes    Patient education offered and stated understanding. Denies questions at this time.      Patient arrived to CC treatment. VSS, PAC accessed in treatment and positive for blood return, and assessment complete; please refer to doc flowsheet for details. Kanjinti given w/o complications, patient tolerated well. PAC positive for blood return and deaccessed per protocol. All questions and concerns addressed. Patient left CC treatment in stable condition.

## 2022-05-24 ENCOUNTER — Encounter: Admit: 2022-05-24 | Discharge: 2022-05-24 | Payer: BC Managed Care – PPO

## 2022-05-24 DIAGNOSIS — K219 Gastro-esophageal reflux disease without esophagitis: Secondary | ICD-10-CM

## 2022-05-24 DIAGNOSIS — E041 Nontoxic single thyroid nodule: Secondary | ICD-10-CM

## 2022-05-24 DIAGNOSIS — C50411 Malignant neoplasm of upper-outer quadrant of right female breast: Secondary | ICD-10-CM

## 2022-05-24 DIAGNOSIS — D0511 Intraductal carcinoma in situ of right breast: Secondary | ICD-10-CM

## 2022-05-24 DIAGNOSIS — C50911 Malignant neoplasm of unspecified site of right female breast: Secondary | ICD-10-CM

## 2022-05-24 DIAGNOSIS — C50919 Malignant neoplasm of unspecified site of unspecified female breast: Secondary | ICD-10-CM

## 2022-05-24 DIAGNOSIS — R918 Other nonspecific abnormal finding of lung field: Secondary | ICD-10-CM

## 2022-05-24 DIAGNOSIS — J45909 Unspecified asthma, uncomplicated: Secondary | ICD-10-CM

## 2022-05-24 LAB — POC CREATININE, RAD: CREATININE, POC: 0.8 mg/dL (ref >60)

## 2022-05-24 MED ORDER — IOHEXOL 350 MG IODINE/ML IV SOLN
70 mL | Freq: Once | INTRAVENOUS | 0 refills | Status: CP
Start: 2022-05-24 — End: ?
  Administered 2022-05-24: 13:00:00 70 mL via INTRAVENOUS

## 2022-05-24 MED ORDER — TRASTUZUMAB-ANNS IVPB
6 mg/kg | Freq: Once | INTRAVENOUS | 0 refills | Status: CP
Start: 2022-05-24 — End: ?
  Administered 2022-05-24 (×2): 466.2 mg via INTRAVENOUS

## 2022-05-24 MED ORDER — SODIUM CHLORIDE 0.9 % IJ SOLN
50 mL | Freq: Once | INTRAVENOUS | 0 refills | Status: CP
Start: 2022-05-24 — End: ?
  Administered 2022-05-24: 13:00:00 50 mL via INTRAVENOUS

## 2022-05-24 NOTE — Progress Notes
I called patient of her CT chest and US thyroid results.   Per provider, recommended to do thyroid biopsy and patient declined for now and she wants to discuss that with dr. Humphrey Rolls at her next appointment in Aug.   I offered to get her in sooner, she said no.   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

## 2022-05-24 NOTE — Progress Notes
Patient arrived to CC treatment for Cycle 7 Day 1 Trastuzumab.    Trastuzumab given w/o complications, patient tolerated well. PAC positive for blood return, flushed with saline, and deaccessed per protocol. Patient declined copy of labs and AVS. All questions and concerns addressed. Patient left CC treatment in stable condition.    CHEMO NOTE  Verified chemo consent signed and in chart.    Blood return positive via: Port (Single)    BSA and dose double checked (agree with orders as written) with: yes, see MAR    Labs/applicable tests checked: Echocardiogram    Chemo regimen: Drug/cycle/day: C7D1    trastuzumab-anns (KANJINTI) 466.2 mg in sodium chloride 0.9% (NS) 272.2 mL IVPB     Rate verified and armband double check with second RN: yes    Patient education offered and stated understanding. Denies questions at this time.

## 2022-05-26 ENCOUNTER — Encounter: Admit: 2022-05-26 | Discharge: 2022-05-26 | Payer: BC Managed Care – PPO

## 2022-06-14 ENCOUNTER — Encounter: Admit: 2022-06-14 | Discharge: 2022-06-14 | Payer: BC Managed Care – PPO

## 2022-06-14 DIAGNOSIS — C50411 Malignant neoplasm of upper-outer quadrant of right female breast: Secondary | ICD-10-CM

## 2022-06-14 MED ORDER — TRASTUZUMAB-ANNS IVPB
6 mg/kg | Freq: Once | INTRAVENOUS | 0 refills | Status: CP
Start: 2022-06-14 — End: ?
  Administered 2022-06-14 (×2): 466.2 mg via INTRAVENOUS

## 2022-06-14 NOTE — Patient Instructions
Call Immediately to report the following:  Unexplained bleeding or bleeding that will not stop  Difficulty swallowing  Shortness of breath, wheezing, or trouble breathing  Rapid, irregular heartbeat; chest pain  Dizziness, lightheadedness  Rash or cut that swells or turns red, feels hot or painful, or begin to ooze  Diarrhea   Uncontrolled nausea or vomiting  Fever of 100.4 F or higher, or chills    Important Phone Numbers:  Cancer Center Main Number (answered 24 hours a day) [phone]  Cancer Center Scheduling (appointments) [phone]  Social Worker [phone]  Nutritionist [phone]

## 2022-06-14 NOTE — Progress Notes
CHEMO NOTE  Verified chemo consent signed and in chart.    Blood return positive via: Port (Single)    BSA and dose double checked (agree with orders as written) with: yes      Labs/applicable tests checked: Echocardiogram    Chemo regimen: Drug/cycle/day trastuzumab-anns Calla Kicks) 466.2 mg in sodium chloride 0.9% (NS) 272.2 mL IVPB    Rate verified and armband double check with second RN: yes    Patient education offered and stated understanding. Denies questions at this time.    Chemo consent signed on 10/12/21.  Pt tolerated trastuzumab-anns (KANJINTI) 466.2 mg in sodium chloride 0.9% (NS) 272.2 mL IVPB infusion without incident.  Pt denies need for AVS, left ambulatory at 1528.

## 2022-06-15 ENCOUNTER — Encounter: Admit: 2022-06-15 | Discharge: 2022-06-15 | Payer: BC Managed Care – PPO

## 2022-06-19 ENCOUNTER — Encounter: Admit: 2022-06-19 | Discharge: 2022-06-19 | Payer: BC Managed Care – PPO

## 2022-06-20 ENCOUNTER — Encounter: Admit: 2022-06-20 | Discharge: 2022-06-20 | Payer: BC Managed Care – PPO

## 2022-06-20 ENCOUNTER — Ambulatory Visit: Admit: 2022-06-20 | Discharge: 2022-06-20 | Payer: BC Managed Care – PPO

## 2022-06-20 DIAGNOSIS — Z9889 Other specified postprocedural states: Secondary | ICD-10-CM

## 2022-06-20 DIAGNOSIS — D0511 Intraductal carcinoma in situ of right breast: Secondary | ICD-10-CM

## 2022-06-20 DIAGNOSIS — Z1231 Encounter for screening mammogram for malignant neoplasm of breast: Secondary | ICD-10-CM

## 2022-06-20 DIAGNOSIS — K219 Gastro-esophageal reflux disease without esophagitis: Secondary | ICD-10-CM

## 2022-06-20 DIAGNOSIS — C50411 Malignant neoplasm of upper-outer quadrant of right female breast: Secondary | ICD-10-CM

## 2022-06-20 DIAGNOSIS — C50919 Malignant neoplasm of unspecified site of unspecified female breast: Secondary | ICD-10-CM

## 2022-06-20 DIAGNOSIS — J45909 Unspecified asthma, uncomplicated: Secondary | ICD-10-CM

## 2022-06-20 DIAGNOSIS — Z08 Encounter for follow-up examination after completed treatment for malignant neoplasm: Secondary | ICD-10-CM

## 2022-06-20 DIAGNOSIS — Z9189 Other specified personal risk factors, not elsewhere classified: Secondary | ICD-10-CM

## 2022-06-20 DIAGNOSIS — R921 Mammographic calcification found on diagnostic imaging of breast: Secondary | ICD-10-CM

## 2022-06-20 NOTE — Progress Notes
Lymphedema Prevention Bioimpedance Spectroscopy (BIS) Monitoring    Reviewed BIS testing results from today.  Results:     Current: 0.9  Baseline: 6.6  Change from Baseline: -5.7      WNL less than 3 standard deviation increase from baseline.      Notified patient via MyChart result was normal and to continue with routine follow up as scheduled. Provided clinic contact information for any questions or concerns.

## 2022-06-20 NOTE — Progress Notes
Name: Kristy Martinez          MRN: [id number]      DOB: [birth year]      AGE: 61 y.o. Patient identification was verified with two patient identifiers prior to today's discussion.    DATE OF SERVICE: 06/20/2022    Subjective:             Reason for Visit:  Heme/Onc Care      Kristy Martinez is a 61 y.o. female.      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 She is 9 months out from surgery. She is doing well today and has no focal breast or systemic concerns.      HISTORY:  Kristy Martinez is a female who presented to the [facility] on 08/19/2021 at age 35 for surgical consultation regarding her recently diagnosed right breast cancer. Ms. Adwell had no breast concerns such as palpable masses, skin changes, or nipple discharge prior to her screening mammogram which revealed a focal asymmetry with associated microcalcifications in the right breast.  Diagnostic mammogram showed a persistent asymmetry with associated fine pleomorphic calcifications in aggregate measuring approximately 2.3 cm.  There was no ultrasound correlate, therefore stereotactic biopsy was performed on 08/11/21 ([place]).  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 She has a history of a left breast biopsy in 2017 that showed a fibroadenoma.  She otherwise has no prior history of breast biopsies or surgeries. She underwent right RSL lumpectomy on 08/29/21.  Final pathology revealed 0.8 cm grade 2 hormone negative/HER2 positive IDC; 1.6 cm DCIS; LVI absent; margins clear (DCIS < 1 mm from medial margin).  Kristy Martinez subsequently underwent right SLNB/port placement on 09/19/21.  Final pathology revealed 3 negative lymph nodes. She completed adjuvant Taxol/Herceptin on 09/06/21. She completed radiation therapy with Dr. Isabella Stalling on 03/02/22. She is continuing Herceptin, and Arimidex is planned.    BREAST IMAGING:  Mammogram:    -- Bilateral screening mammogram 07/22/21 ([place]) revealed no suspicious abnormality is seen in the left breast. In the upper outer right breast, middle/posterior depth, there is a focal asymmetry with associated microcalcifications (MLO slice 30 and CC slice 38). Recommend right diagnostic mammogram and targeted ultrasound for further evaluation.   -- Right diagnostic mammogram 08/08/21 ([place]) revealed a persistent 1.5 cm focal asymmetry with subtle architectural distortion within the upper outer right breast middle-posterior depth at approximately 10:00. The focal asymmetry has associated fine pleomorphic calcifications in aggregate measuring approximately 2.0 x 1.5 x 2.3 cm in the AP, TV and CC dimensions.   Ultrasound:    -- Targeted right breast ultrasound 08/08/21 ([place]) revealed no convincing ultrasound correlate is identified for the mammographic focal asymmetry of concern. 5 morphologically normal appearing right axillary lymph nodes are documented. Impression:  Suspicious focal asymmetry with associated calcifications in the upper outer right breast at 10:00. Stereotactic/tomosynthesis guided biopsy is recommended.     REPRODUCTIVE HEALTH:  Age at first Menarche:  59  Age at First Live Birth:  2  Age at Menopause:  31, no HRT  Gravida:  1  Para: 1  Breastfeeding:  None     PROCEDURE:   1.  Right RSL lumpectomy, 08/29/21 [facility])  2.  Right right SLNB/port placement, 09/19/21 ([place])  PERTINENT PMH:  Asthma  FAMILY HISTORY:    Breast Cancer: Maternal Grandma dx age 46s  Ovarian Cancer: None   Prostate Cancer: Maternal Mercy Moore dx age 86    MEDICAL ONCOLOGY: Dr. Arliss Journey transferred to Dr. Windy Carina THERAPY: Taxol/Herceptin completed 09/06/21; Herceptin continues.  REFERRED BY: Dr Steva Ready            Review of Systems   Constitutional: Negative for appetite change, chills, fatigue and fever.   HENT: Negative for congestion, hearing loss, postnasal drip, rhinorrhea, sinus pressure and tinnitus.    Eyes: Negative for pain, discharge and itching.   Respiratory: Negative for cough, chest tightness and shortness of breath.    Cardiovascular: Negative for chest pain and palpitations.   Gastrointestinal: Negative for abdominal distention, abdominal pain, diarrhea, nausea and vomiting.   Genitourinary: Negative for difficulty urinating, frequency, pelvic pain and vaginal bleeding.   Musculoskeletal: Negative for arthralgias, back pain, joint swelling, myalgias and neck pain.   Skin: Negative for rash.   Neurological: Negative for dizziness, weakness, light-headedness and headaches.   Hematological: Does not bruise/bleed easily.   Psychiatric/Behavioral: Negative for sleep disturbance. The patient is not nervous/anxious.        The following medical/surgical/family/social history and the list of medications are current, as of 06/20/2022    Medical History:   Diagnosis Date   ? Asthma    ? Breast cancer (HCC)    ? Ductal carcinoma in situ (DCIS) of right breast 07/2021   ? GERD (gastroesophageal reflux disease)      Surgical History:   Procedure Laterality Date   ? TUBAL LIGATION  1983   ? Right Radioactive Seed Localized Lumpectomy Right 08/29/2021    Performed by Ignatius Specking, MD at [facility]   ? RADIOLOGICAL EXAM SURGICAL SPECIMEN Right 08/29/2021    Performed by Ignatius Specking, MD at [facility]   ? ADJACENT TRANSFER/ REARRANGEMENT 10.1 SQ CM TO 30.0 SQ CM - TORSO Right 08/29/2021 Performed by Ignatius Specking, MD at [facility]   ? IDENTIFICATION SENTINEL LYMPH NODE Right 09/19/2021    Performed by Ignatius Specking, MD at [facility]   ? INJECTION RADIOACTIVE TRACER FOR SENTINEL NODE IDENTIFICATION Right 09/19/2021    Performed by Ignatius Specking, MD at [facility]   ? Right Sentinel Lymph Node Biopsy Right 09/19/2021    Per[facility] by [facility]ius Specking[facility] at [facility]   ? INSERTION TUNNELED CENTRAL VENOUS ACCESS DEVICE WITH SUBCUTANEOUS PORT - AGE 34 YEARS AND OVER  09/19/2021    Performed by Dellia Cloud, DO at [facility]   ? FLUOROSCOPIC GUIDANCE CENTRAL VENOUS ACCESS DEVICE PLACEMENT/ REPLACEMENT/ REMOVAL  09/19/2021    Performed by Dellia Cloud, DO at [facility]   ? TONSILLECTOMY      at age 63      Family History   Problem Relation Age of Onset   ? Arthritis-osteo Mother    ? Thyroid Disease Father    ?  Cancer-Breast Paternal Aunt 81   ? Cancer-Breast Maternal Grandmother         8s   ? Cancer-Prostate Maternal Grandfather    ? Diabetes Paternal Grandmother      Social History     Socioeconomic History   ? Marital status: Married   Tobacco Use   ? Smoking status: Never   ? Smokeless tobacco: Never   Vaping Use   ? Vaping Use: Never used   Substance and Sexual Activity   ? Alcohol use: Yes     Alcohol/week: 1.0 standard drink of alcohol     Types: 1 Glasses of wine per week     Comment: monthly   ? Drug use: Not Currently         No Known Allergies        Objective:         ? acetaminophen (TYLENOL) 325 mg tablet Take two tablets by mouth every 6 hours as needed for Pain. Indications: take two tabs every 6 hours for the first three days after surgery, then take as needed   ? cetirizine HCl (ZYRTEC PO) Take  by mouth.   ? citalopram (CELEXA) 20 mg tablet Take one tablet by mouth daily.   ? FLOVENT HFA 110 mcg/actuation inhaler    ? lidocaine/prilocaine (EMLA) 2.5/2.5 % topical cream APPLY NICKEL SIZE AMOUNT OF CREAM TO THE CENTER OF THE PORT, 30 MINUTES PRIOR TO PORT ACCESS, COVER THE SITE WITH NON ADHESIVE BANDAGE.   ? LORazepam (ATIVAN) 0.5 mg tablet Take one tablet by mouth every 8 hours as needed for Nausea, Vomiting or Other... (insomnia). Max Daily Amount: 1.5 mg.   ? omeprazole DR (PRILOSEC) 20 mg capsule      Vitals:    06/20/22 0846   BP: 137/80   BP Source: Arm, Left Upper   Pulse: 80   Temp: 36.7 ?C (98 ?F)   SpO2: 98%   TempSrc: Temporal   PainSc: Zero   Weight: 79.8 kg (176 lb)   Height: 157.5 cm (5' 2)     Body mass index is 32.19 kg/m?Marland Kitchen     Pain Score: Zero       Fatigue Scale:  (denies concerns)    Pain Addressed:  N/A    Patient Evaluated for a Clinical Trial: No treatment clinical trial available for this patient.     [place] Cooperative Oncology Group performance status is 0, Fully active, able to carry on all pre-disease performance without restriction.Marland Kitchen     Physical Exam  Vitals reviewed.   Chest:                RIGHT BREAST EXAM:  Breast:  Consistent with previous lumpectomy/SLNB. Radiation skin changes and mild central breast lymphedema.  No palpable masses.   Skin Erythema:  No  Attachment of Overlying Skin:  No  Peau d' orange:  No  Chest Wall Attachment:  No  Nipple Inversion:  No  Nipple Discharge: No    LEFT BREAST EXAM:  Breast: No palpable breast masses. No skin, nipple, or areolar change.   Skin Erythema:  No  Attachment of Overlying Skin:  No  Peau d' orange:  No  Chest Wall Attachment: No  Nipple Inversion:  No  Nipple Discharge:  No    RIGHT NODAL BASIN EXAM:  Axillary:  negative  Infraclavicular:  negative  Supraclavicular:  negative    LEFT NODAL BASIN EXAM:  Axillary:  negative  Infraclavicular: negative  Supraclavicular:  negative  Constitutional: No acute distress.  HEENT:  Head: Normocephalic and atraumatic.  Eyes: No discharge. No scleral icterus.  Pulmonary/Chest: No respiratory distress.   Lymphadenopathy: There is no axillary, infraclavicular, or supraclavicular adenopathy.  Neurological: Alert and oriented to person, place and time. No cranial nerve deficit.  Skin: Warm and dry. No rash noted. No erythema. No pallor.  Psychiatric: Normal mood and affect. Behavior is normal. Judgement and thought content normal.    Imaging Review:    [id number] MAMMO DIAGNOSTIC BIL/TOMO: June 20, 2022 - ACCESSION #:   [id number]   2D/3D Procedure   3D Routine views.   2D Routine views.     Prior study comparison: August 11, 2021, right breast [id number]   MAMMO DIAGNOSTIC RT/TOMO, performed at [facility]. ?August 08, 2021, right breast [id number] MAMMO   DIAGNOSTIC RT/TOMO, performed at [facility]. ?July 22, 2021, bilateral [id number] DIGITAL MAMMO   SCREEN BILAT/TOMO/CAD, performed at [facility]. ?May 10, 2016, bilateral mammogram.     There are scattered areas of fibroglandular density. ?History:   61 year old female with personal history of right breast invasive   ductal carcinoma and DCIS status post breast conservation   therapy. She presents for first posttreatment mammogram.     2-D and 3-D images of both breasts were performed, including   right breast spot magnification views.     There are post therapeutic findings in the right breast. Loosely   grouped tiny round calcifications are present at the lumpectomy   site in the upper outer right breast at 10:00 measuring 4.5 cm AP   x 6.5 cm transverse x 1.8 cm craniocaudal. The may be related to   prior surgical and radiation treatment. Short-term follow-up   right breast diagnostic mammogram is recommended in 6 months.     No suspicious abnormality is identified in the left breast.       Finalized by Konrad Saha, M.D. on 06/20/2022 10:11 AM.   Dictated by Konrad Saha, M.D. on 06/20/2022 8:30 AM.       Electronically signed and approved by: Konrad Saha, M.D.   [id number]   IMPRESSION     ASSESSMENT: BIRAD 3 -Probably benign         RECOMMENDATION:   Follow-up diagnostic mammogram of the right breast in 6 months. Assessment and Plan:    61 y/o female with right grade 2-3 DCIS (ER 70%, PR 10%) at 10:00, dx 07/2021 upgraded to grade 2 IDC (ER < 1%, PR 0%, HER2 3+) on surgical pathology. Stage IA, pT1bN0 - NED      Kristy Martinez continues to do well.  There is no clinical evidence of recurrence.  There have been no changes to her past medical or family history since her last visit.  She had a bilateral diagnostic mammogram prior to her clinic visit today, and results showed calcifications at the lumpectomy site, possibly related to prior surgical and radiation treatment. Right diagnostic mammogram in 6 months was recommended.  We discussed her breast lymphedema, which is mildly bothersome to her.  It was discussed that breast lymphedema is a normal result of surgery and radiation.  It is not harmful, and does not increase her risk of cancer recurrence.  It is also separate from her arm, and does not increase the risk of arm lymphedema. The mainstay of management is wearing a compression garment for as many hours out of the  day/night as tolerated.  She was advised to avoid use of an under-wire.  It was discussed that it can take up to 4-6 weeks for symptoms to improve.  If symptoms persist despite these conservative measures, we can facilitate follow up in the lymphedema clinic for more aggressive treatment. She will be starting Arimidex soon, and will continue to follow with Dr. Welton Flakes.  Kristy Martinez had bioimpedence spectroscopy (BIS) measurements today, and will continue to have this with each follow up visit in lieu of manual lymphedema measurements as long as her readings remain WNL. She denies any arm swelling or ROM issues. She will RTC in 1 year at the time of her next screening imaging.  She will call in the interim with any questions or concerns. Kristy Martinez was given ample time to ask questions all of which were answered to her satisfaction.       1. Continue to follow with Dr. Welton Flakes  2. Continue to follow in the lymphedema clinic for BIS surveillance  3. Right diagnostic mammogram in 6 months - contact with results  4. Bilateral screening mammogram in 1 year  5. RTC in 1 year      Scarlett Presto, [place]      Based on the Cures Act all results will be immediately released to MyChart.  We will be completing a thorough review of the additional information and putting it in context with the overall diagnosis.  We will call once this has been completed to discuss the results, as well as the next steps in treatment.

## 2022-06-20 NOTE — Progress Notes
Bioimpedance Spectroscopy performed.  Advised patient that additional information will be sent via Mychart (preferred) or phone if indicated by the lymphedema nurse within 24 hours.

## 2022-06-25 ENCOUNTER — Encounter: Admit: 2022-06-25 | Discharge: 2022-06-25 | Payer: BC Managed Care – PPO

## 2022-07-04 ENCOUNTER — Encounter: Admit: 2022-07-04 | Discharge: 2022-07-04 | Payer: BC Managed Care – PPO

## 2022-07-04 DIAGNOSIS — C50411 Malignant neoplasm of upper-outer quadrant of right female breast: Secondary | ICD-10-CM

## 2022-07-04 DIAGNOSIS — C50919 Malignant neoplasm of unspecified site of unspecified female breast: Secondary | ICD-10-CM

## 2022-07-04 DIAGNOSIS — D0511 Intraductal carcinoma in situ of right breast: Secondary | ICD-10-CM

## 2022-07-04 DIAGNOSIS — K219 Gastro-esophageal reflux disease without esophagitis: Secondary | ICD-10-CM

## 2022-07-04 DIAGNOSIS — J45909 Unspecified asthma, uncomplicated: Secondary | ICD-10-CM

## 2022-07-04 MED ORDER — TRASTUZUMAB-ANNS IVPB
6 mg/kg | Freq: Once | INTRAVENOUS | 0 refills | Status: CP
Start: 2022-07-04 — End: ?
  Administered 2022-07-04 (×2): 466.2 mg via INTRAVENOUS

## 2022-07-04 NOTE — Progress Notes
CHEMO NOTE  Verified chemo consent signed and in chart.    Blood return positive via: Port (Single and Accessed)    BSA and dose double checked (agree with orders as written) with: yes, see MAR    Labs/applicable tests checked: Echocardiogram    Chemo regimen: Drug/cycle/day C13D1  trastuzumab-anns (KANJINTI) 466.2 mg in sodium chloride 0.9% (NS) 272.2 mL IVPB    Rate verified and armband double check with second RN: yes    Patient education offered and stated understanding. Denies questions at this time.    Patient presents for C13D1 herceptin after being evaluated in clinic. Port positive for blood return and orders reviewed. Patient tolerated treatment. Port positive for blood return and de-accessed per protocol. Patient dismissed in stable condition.

## 2022-07-05 ENCOUNTER — Encounter: Admit: 2022-07-05 | Discharge: 2022-07-05 | Payer: BC Managed Care – PPO

## 2022-07-05 DIAGNOSIS — K219 Gastro-esophageal reflux disease without esophagitis: Secondary | ICD-10-CM

## 2022-07-05 DIAGNOSIS — J45909 Unspecified asthma, uncomplicated: Secondary | ICD-10-CM

## 2022-07-05 DIAGNOSIS — C50919 Malignant neoplasm of unspecified site of unspecified female breast: Secondary | ICD-10-CM

## 2022-07-05 DIAGNOSIS — D0511 Intraductal carcinoma in situ of right breast: Secondary | ICD-10-CM

## 2022-07-09 ENCOUNTER — Encounter: Admit: 2022-07-09 | Discharge: 2022-07-09 | Payer: BC Managed Care – PPO

## 2022-07-10 NOTE — Progress Notes
Thyroid US dated 05/24/2022  sent to PCP on file. Report recommends a thyroid bx.

## 2022-07-16 ENCOUNTER — Encounter: Admit: 2022-07-16 | Discharge: 2022-07-16 | Payer: BC Managed Care – PPO

## 2022-07-17 ENCOUNTER — Encounter: Admit: 2022-07-17 | Discharge: 2022-07-17 | Payer: BC Managed Care – PPO

## 2022-07-29 ENCOUNTER — Encounter: Admit: 2022-07-29 | Discharge: 2022-07-29 | Payer: BC Managed Care – PPO

## 2022-07-29 DIAGNOSIS — C50411 Malignant neoplasm of upper-outer quadrant of right female breast: Secondary | ICD-10-CM

## 2022-07-29 MED ORDER — TRASTUZUMAB-ANNS IVPB
6 mg/kg | Freq: Once | INTRAVENOUS | 0 refills | Status: CP
Start: 2022-07-29 — End: ?
  Administered 2022-07-29 (×2): 466.2 mg via INTRAVENOUS

## 2022-07-29 NOTE — Progress Notes
CHEMO NOTE  Verified chemo consent signed and in chart.    Blood return positive via: Port (Single)    BSA and dose double checked (agree with orders as written) with: yes rn    Labs/applicable tests checked: CBC and Comprehensive Metabolic Panel (CMP)    Chemo regimen: Drug/cycle/day C10 D1    trastuzumab-anns (KANJINTI) 466.2 mg in sodium chloride 0.9% (NS) 272.2 mL IVPB      Rate verified and armband double check with second RN: yes    Patient education offered and stated understanding. Denies questions at this time.      Patient arrived to CC treatment. VSS, Port positive for blood return, and assessment complete; please refer to doc flowsheet for details. Kanjinti given w/o complications, patient tolerated well. Port positive for blood return and deaccessed per protocol. All questions and concerns addressed. Patient left CC treatment in stable condition.

## 2022-07-31 ENCOUNTER — Encounter: Admit: 2022-07-31 | Discharge: 2022-07-31 | Payer: BC Managed Care – PPO

## 2022-07-31 ENCOUNTER — Ambulatory Visit: Admit: 2022-07-31 | Discharge: 2022-07-31 | Payer: BC Managed Care – PPO

## 2022-07-31 DIAGNOSIS — C50411 Malignant neoplasm of upper-outer quadrant of right female breast: Secondary | ICD-10-CM

## 2022-08-14 ENCOUNTER — Encounter: Admit: 2022-08-14 | Discharge: 2022-08-14 | Payer: BC Managed Care – PPO

## 2022-08-14 NOTE — Progress Notes
Phone call placed to PCP on file. LVM regarding imaging recommendations. Final attempt to contact outside provider regarding recommendations.

## 2022-08-21 ENCOUNTER — Encounter: Admit: 2022-08-21 | Discharge: 2022-08-21 | Payer: BC Managed Care – PPO

## 2022-08-21 DIAGNOSIS — C50411 Malignant neoplasm of upper-outer quadrant of right female breast: Secondary | ICD-10-CM

## 2022-08-21 MED ORDER — TRASTUZUMAB-ANNS IVPB
6 mg/kg | Freq: Once | INTRAVENOUS | 0 refills | Status: CP
Start: 2022-08-21 — End: ?
  Administered 2022-08-21 (×2): 466.2 mg via INTRAVENOUS

## 2022-08-21 NOTE — Progress Notes
..  CHEMO NOTE  Verified chemo consent signed and in chart.    Blood return positive via: Port (Single)    BSA and dose double checked (agree with orders as written) with: yes; see MAR     Labs/applicable tests checked: None    Chemo regimen: C11D1 Kanjinti      Rate verified and armband double check with second RN: yes    Patient education offered and stated understanding. Denies questions at this time.    .Patient arrived to clinic for scheduled Kanjinti. Port accessed and fushed with positive blood return noted. Vitals and assessment WDL for treatment. Patient received treatment; tolerated well. Port flushed and de-accessed. Patient left clinic in stable condition.

## 2022-09-06 ENCOUNTER — Encounter: Admit: 2022-09-06 | Discharge: 2022-09-06 | Payer: BC Managed Care – PPO

## 2022-09-12 ENCOUNTER — Encounter: Admit: 2022-09-12 | Discharge: 2022-09-12 | Payer: BC Managed Care – PPO

## 2022-09-12 DIAGNOSIS — J45909 Unspecified asthma, uncomplicated: Secondary | ICD-10-CM

## 2022-09-12 DIAGNOSIS — K219 Gastro-esophageal reflux disease without esophagitis: Secondary | ICD-10-CM

## 2022-09-12 DIAGNOSIS — C50411 Malignant neoplasm of upper-outer quadrant of right female breast: Secondary | ICD-10-CM

## 2022-09-12 DIAGNOSIS — C50919 Malignant neoplasm of unspecified site of unspecified female breast: Secondary | ICD-10-CM

## 2022-09-12 DIAGNOSIS — D0511 Intraductal carcinoma in situ of right breast: Secondary | ICD-10-CM

## 2022-09-12 MED ORDER — TRASTUZUMAB-ANNS IVPB
6 mg/kg | Freq: Once | INTRAVENOUS | 0 refills | Status: CP
Start: 2022-09-12 — End: ?
  Administered 2022-09-12 (×2): 466.2 mg via INTRAVENOUS

## 2022-09-12 NOTE — Progress Notes
Patient arrived to [place] treatment for Cycle 12 Day 1 Kanjinti.    Kanjinti given w/o complications, patient tolerated well. PAC positive for blood return, flushed with saline, and deaccessed per protocol. Patient declined copy of labs and AVS. All questions and concerns addressed. Patient left CC treatment in stable condition.    CHEMO NOTE  Verified chemo consent signed and in chart.    Blood return positive via: Port (Single)    BSA and dose double checked (agree with orders as written) with: yes, see MAR    Labs/applicable tests checked: ECHO    Chemo regimen: C12D1    trastuzumab-anns (KANJINTI) 466.2 mg in sodium chloride 0.9% (NS) 272.2 mL IVPB     Rate verified and armband double check with second RN: yes    Patient education offered and stated understanding. Denies questions at this time.

## 2022-10-01 NOTE — Progress Notes
Radiation Oncology Follow Up Note  Date: 10/02/2022       Kristy Martinez is a 61 y.o. female.     The encounter diagnosis was Abnormal mammogram of left breast.  Staging:  Cancer Staging   Malignant neoplasm of upper-outer quadrant of right breast in female, estrogen receptor negative   Staging form: Breast, AJCC 8th Edition  - Clinical stage from 08/17/2021: Stage 0 (cTis (DCIS), cN0, cM0, ER+, PR+, HER2: Not Assessed) - Signed by Scarlett Presto, PA-C on 08/17/2021  - Pathologic stage from 09/05/2021: Stage IA (pT1b, pN0, cM0, G2, ER-, PR-, HER2+) - Signed by Scarlett Presto, PA-C on 09/21/2021      History of Present Illness  Kristy Martinez. [name] is a 61 y.o. female with oncologic history as follows:  ?  - 07/22/2021 ([place]) bilateral screening mammogram: No suspicious abnormality in the left breast. ?In the upper outer right breast, middle posterior depth there is a focal asymmetry with associated microcalcifications.  -?08/08/2021 ([place]) right diagnostic mammogram: Persistent 1.5 cm focal asymmetry with subtle architectural distortion in the upper outer right breast middle posterior depth at approximately 10:00,?with associated fine pleomorphic calcifications. ?The calcifications in aggregate measure approximately 2.0 x 1.5 x 2.3 cm.  -?08/08/2021 ([place]) targeted right breast ultrasound: No convincing ultrasound correlate identified for mammographic asymmetry of concern. ?5 morphologically normal-appearing right axillary lymph nodes. ?Suspicious focal asymmetry with associated calcifications upper outer right breast 10:00, biopsy recommended.  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  -08/29/2021 right radioactive seed localized lumpectomy. ?The final pathology showing a small focus of invasive ductal carcinoma, grade 2 with extensive intraductal component, high nuclear grade. ?ER less than 1%, PR 0, HER2 3+.  -On 09/19/2021 the patient underwent right axillary sentinel node biopsy. ?Final pathology confirmed 0 of 3 lymph nodes to be involved with metastatic carcinoma.  ?  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 ER 70%, PR 10%. Diagnosed 07/2021  ?  Treatment History:   Patient underwent adjuvant radiotherapy to her right breast to a dose of 4256 cGy in 16 fractions from 02/09/2022 - 03/02/2022. She tolerated treatment well and had no treatment interruptions.     ? 03/02/2022   Course ID [id number] [id number] [id number]   First Treatment Date 02-09-2022 02:10PM   Last Treatment Date 03-02-2022 03:24PM   Treatment Elapsed Days 21   Reference Point ID RtBreast   Dosage Given To Date 42.56   Session Dosage Given 2.66   Plan ID [id number]   Fractions Treated to Date 16   Total Fractions on Plan 16   Prescribed Dose per Fraction 2.66   Prescription Dose 4,256   ?  Subjective:       [name] returns today for routine follow-up. She is accompanied by her husband to today's visit. Kristy Martinez reports she is doing quite well. She is excited to be finishing herceptin next month. She has tolerated this well with only experiencing minimal fatigue. Her hair is growing back in and she is very happy about this. Patient denies neurologic symptoms such as headache, dizziness or vision changes. Patient denies shortness of air, cough or chills. She denies pain or tenderness in her breast. She is aware of some edema of her breast and wears a compression bra. The edema is not bothersome for her. Range of motion in the right  arm is good. She is retiring at the end of the year and is looking forward to having more free time and doing some traveling.        Review of Systems   Constitutional: Positive for fatigue. Negative for fever.   HENT: Negative for sore throat and trouble swallowing.    Respiratory: Negative for cough and shortness of breath.    Musculoskeletal: Negative for arthralgias and myalgias.   Skin: Negative for color change (darkening of skin on treated breast).   Neurological: Negative for light-headedness and headaches.   Hematological: Negative for adenopathy.   Psychiatric/Behavioral: The patient is not nervous/anxious.    All other systems reviewed and are negative.      Objective:         ? acetaminophen (TYLENOL) 325 mg tablet Take two tablets by mouth every 6 hours as needed for Pain. Indications: take two tabs every 6 hours for the first three days after surgery, then take as needed   ? cetirizine HCl (ZYRTEC PO) Take  by mouth.   ? citalopram (CELEXA) 20 mg tablet Take one tablet by mouth daily.   ? FLOVENT HFA 110 mcg/actuation inhaler    ? lidocaine/prilocaine (EMLA) 2.5/2.5 % topical cream APPLY NICKEL SIZE AMOUNT OF CREAM TO THE CENTER OF THE PORT, 30 MINUTES PRIOR TO PORT ACCESS, COVER THE SITE WITH NON ADHESIVE BANDAGE.   ? LORazepam (ATIVAN) 0.5 mg tablet Take one tablet by mouth every 8 hours as needed for Nausea, Vomiting or Other... (insomnia). Max Daily Amount: 1.5 mg.   ? omeprazole DR (PRILOSEC) 20 mg capsule    ? silver sulfADIAZINE (SILVADENE) 1 % topical cream      Vitals:    10/02/22 1129   BP: (!) 161/64   Pulse: (P) 89   Temp: (P) 36.8 ?C (98.2 ?F)   SpO2: (P) 94%   Weight: (P) 79.4 kg (175 lb)     Body mass index is 32.01 kg/m? (pended).              Fatigue Scale: 0-None    KARNOFSKY PERFORMANCE SCORE:  100% Normal, no complaints     Physical Exam  Vitals reviewed.   Constitutional:       General: She is not in acute distress.     Appearance: Normal appearance. She is well-developed. She is not ill-appearing.   HENT:      Head: Normocephalic and atraumatic.   Eyes:      General: Lids are normal.      Extraocular Movements: Extraocular movements intact.      Conjunctiva/sclera: Conjunctivae normal.   Cardiovascular:      Rate and Rhythm: Normal rate and regular rhythm.      Heart sounds: Normal heart sounds.   Pulmonary:      Effort: Pulmonary effort is normal.      Breath sounds: Normal breath sounds.   Chest:      Comments: No palpable abnormalities in bilateral breasts. Dependent edema noted right breast.  No skin, nipple, or areolar changes bilaterally, aside from hyperpgimentation of the right breast.   Musculoskeletal:      Cervical back: Neck supple.   Lymphadenopathy:      Cervical: No cervical adenopathy.      Upper Body:      Right upper body: No supraclavicular or axillary adenopathy.      Left upper body: No supraclavicular or axillary adenopathy.   Skin:     General: Skin is warm and  dry.      Coloration: Skin is not pale.   Neurological:      Mental Status: She is alert and oriented to person, place, and time. Mental status is at baseline.      Cranial Nerves: Cranial nerves 2-12 are intact.      Sensory: Sensation is intact.      Motor: Motor function is intact.   Psychiatric:         Attention and Perception: Attention normal.         Mood and Affect: Mood and affect normal.         Speech: Speech normal.         Behavior: Behavior normal.         Cognition and Memory: Cognition and memory normal.            Laboratory:    Comprehensive Metabolic Profile    Lab Results   Component Value Date/Time    NA 140 01/11/2022 01:31 PM    K 3.6 01/11/2022 01:31 PM    CL 104 01/11/2022 01:31 PM    CO2 31 (H) 01/11/2022 01:31 PM    GAP 5 01/11/2022 01:31 PM    BUN 13 01/11/2022 01:31 PM    CR 0.8 05/24/2022 08:06 AM    CR 0.72 01/11/2022 01:31 PM    GLU 136 (H) 01/11/2022 01:31 PM    Lab Results   Component Value Date/Time    CA 8.6 01/11/2022 01:31 PM    ALBUMIN 3.8 01/11/2022 01:31 PM    TOTPROT 6.3 01/11/2022 01:31 PM    ALKPHOS 64 01/11/2022 01:31 PM    AST 16 01/11/2022 01:31 PM    ALT 18 01/11/2022 01:31 PM    TOTBILI 0.3 01/11/2022 01:31 PM        CBC w diff    Lab Results   Component Value Date/Time    WBC 5.7 01/11/2022 01:31 PM    RBC 3.61 (L) 01/11/2022 01:31 PM    HGB 11.3 (L) 01/11/2022 01:31 PM    HCT 33.6 (L) 01/11/2022 01:31 PM    MCV 93.1 01/11/2022 01:31 PM    MCH 31.4 01/11/2022 01:31 PM    MCHC 33.7 01/11/2022 01:31 PM    RDW 16.9 (H) 01/11/2022 01:31 PM    PLTCT 360 01/11/2022 01:31 PM    MPV 7.3 01/11/2022 01:31 PM    Lab Results   Component Value Date/Time    NEUT 66 01/11/2022 01:31 PM    ANC 3.80 01/11/2022 01:31 PM    LYMA 23 (L) 01/11/2022 01:31 PM    ALC 1.30 01/11/2022 01:31 PM    MONA 6 01/11/2022 01:31 PM    AMC 0.30 01/11/2022 01:31 PM    EOSA 4 01/11/2022 01:31 PM    AEC 0.20 01/11/2022 01:31 PM    BASA 1 01/11/2022 01:31 PM    ABC 0.10 01/11/2022 01:31 PM          Imaging:   2D + DOPPLER ECHO  ?  The left ventricular size is normal. Wall thickness is increased.   Concentric remodeling. The left ventricular systolic function is normal.   The ejection fraction by Simpson's biplane method is 64%. The global   longitudinal strain is -21%. There are no segmental wall motion   abnormalities  ?  The right ventricular size is normal. The right ventricular systolic   function is normal.  ?  Normal biatrial size.  ?  Mild to moderate mitral and tricuspid regurgitation.  ?  Estimated Peak Systolic PA Pressure 28 mmHg  ?  No pericardial effusion.  ?  Compared with studies dated February and June 2023, no significant   change is noted.       Path:  PATHOLOGY REPORT   Date Value Ref Range Status   09/19/2021   Final    [facility]  [email or web address]    Department of Pathology and Laboratory Medicine  [address] [place], [place] [address]  Surgical Pathology Office:  [phone]  Fax:  [phone]  SURGICAL PATHOLOGY REPORT    NAME: MARIADEJESUS, STORRER SURG PATH #: [id number] MR #: [id number] SPECIMEN  CLASS: SI BILLING #: [id number] ALT ID #:  LOCATION: [facility] DATE OF  PROCEDURE: 09/19/2021 AGE:  60 SEX: F DATE RECEIVED: 09/19/2021 DOB:  May 13, 1961  TIME RECEIVED:  11:02 PHYSICIAN: Ignatius Specking, MD DATE OF  REPORT: 09/21/2021 COPY TO:  DATE OF PRINTING: 09/21/2021 ########################################################################  Final Diagnosis:    A. Lymph nodes, right axillary sentinel lymph nodes, biopsy:    Three lymph nodes, negative for metastatic carcinoma (0/3).  Deeper sections and pancytokeratin staining support the absence of  carcinoma.       Attestation:  By this signature, I attest that I have personally formulated the final  interpretation expressed in this report and that the above diagnosis is  based upon my examination of the slides and/or other material indicated in  this report.    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         Gross Description:  A. Received fresh, labeled with the patient's name and right axillary  sentinel lymph nodes, are three tan-yellow lymph nodes measuring 0.7-1.2  cm in greatest dimension. The specimen is entirely submitted as follows:  A1      2 possible lymph nodes, bisected (inked black and blue).   A2      1 possible lymph node, serially sectioned.   A3      Remainder of fibroadipose tissue with possible minute lymph nodes.  (ket)    kp/09/19/2021           If immunohistochemical stains and/or in situ hybridization are cited in  this report, the performance characteristics were determined by the  Department of Pathology and Laboratory Medicine of the [facility] of  [facility] Association) in compliance with CLIA'88  regulations. Some of these tests rely on the use of analyte specific  reagents and are subject to specific labeling requirements by the FDA.  The stains are performed on formalin-fixed, paraffin-embedded tissue,  unless otherwise stated. Known positive and negative control tissues  demonstrate appropriate staining. Results should be interpreted with  caution given the likelihood of false negativity on decalcified specimens.  This testing was developed by the Department of Pathology and Laboratory  Medicine of the [place] [facility] [place]. It has not been cleared or approved  by the FDA.  The FDA has determined that such clearance or approval is not  necessary.    Professional services performed by [employer], [place], at [address], [place], [place]  [address]     ]  Assessment and Plan:    Ms. Advaita Montet is?a  61 y.o. 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 ?Her preop diagnosis was for pure DCIS. ?She status post breast conserving surgery without axillary lymph node sampling on 08/29/2021. ?She was found to have invasive ductal carcinoma ER less than 1% PR 0%, HER2 3+. ?On 09/19/2021 she had right axillary sentinel lymph node biopsy which confirmed a 0 of 3 sentinel nodes containing metastatic disease. ?She received her final chemotherapy on 01/11/2022 and is planned to complete 1 year of Herceptin. She underwent adjuvant radiotherapy to her right breast to a dose of 4256 cGy in 16 fractions from 02/09/2022 - 03/02/2022.     - Malon Kindle was seen today for ongoing, routine follow-up after completion of radiation therapy.  At this time, she is clinically stable with no clinical evidence of disease recurrence or progression.  She has no subacute toxicities from having undergone adjuvant radiation. She does have some residual hyperpigmentation of the treated right breast. Also has lymphedema in the right breast; wearing a compression bra and discussed massage/elevation techniques.   - mammogram 06/20/2022 with post-therapeutic findings as well as tiny calcifications at the lumpectomy site. No suspicious abnormalities in the left breast. F/u mammogram scheduled in February to evaluate the findings in the right breast  - Our plan will be for the patient to return to our clinic in 6 months for continued follow-up.  She may contact us in the interim if there are any questions or concerns requiring earlier assessment.  -The patient will keep their appointments with their other managing providers including Dr. Welton Flakes, she continues on Herceptin to be completed next month; as well as with breast surgery with f/u scheduled for August.            Total time for today's visit was 35 minutes. Visit time spent on the following: preparing to see the patient, obtaining and/or reviewing separately obtained history/information, performing a physical examination and/or evaluation, counseling and educating the patient/family/caregiver, referring to and communication with other health care professionals, documenting clinical information in the electronic or other health record and care coordination.     Liston Alba AGNP-C  [facility] - Radiation Oncology  [address]  [place], [place] [address]    Collaborating Physician: Elby Showers, MD    Parts of this note were created using voice recognition software.  Please excuse any grammatical or typographical errors.

## 2022-10-02 ENCOUNTER — Encounter: Admit: 2022-10-02 | Discharge: 2022-10-02 | Payer: BC Managed Care – PPO

## 2022-10-02 ENCOUNTER — Ambulatory Visit: Admit: 2022-10-02 | Discharge: 2022-10-02 | Payer: BC Managed Care – PPO

## 2022-10-02 DIAGNOSIS — K219 Gastro-esophageal reflux disease without esophagitis: Secondary | ICD-10-CM

## 2022-10-02 DIAGNOSIS — D0511 Intraductal carcinoma in situ of right breast: Secondary | ICD-10-CM

## 2022-10-02 DIAGNOSIS — C50411 Malignant neoplasm of upper-outer quadrant of right female breast: Secondary | ICD-10-CM

## 2022-10-02 DIAGNOSIS — C50919 Malignant neoplasm of unspecified site of unspecified female breast: Secondary | ICD-10-CM

## 2022-10-02 DIAGNOSIS — R928 Other abnormal and inconclusive findings on diagnostic imaging of breast: Secondary | ICD-10-CM

## 2022-10-02 DIAGNOSIS — J45909 Unspecified asthma, uncomplicated: Secondary | ICD-10-CM

## 2022-10-02 DIAGNOSIS — Z09 Encounter for follow-up examination after completed treatment for conditions other than malignant neoplasm: Secondary | ICD-10-CM

## 2022-10-02 MED ORDER — TRASTUZUMAB-ANNS IVPB
6 mg/kg | Freq: Once | INTRAVENOUS | 0 refills | Status: CP
Start: 2022-10-02 — End: ?
  Administered 2022-10-02 (×2): 466.2 mg via INTRAVENOUS

## 2022-10-02 MED ORDER — ALTEPLASE 2 MG IK SOLR
2 mg | Freq: Once | INTRAMUSCULAR | 0 refills | Status: CP
Start: 2022-10-02 — End: ?
  Administered 2022-10-02: 14:00:00 2 mg via INTRAMUSCULAR

## 2022-10-02 NOTE — Progress Notes
CHEMO NOTE  Verified chemo consent signed and in chart.    Blood return positive via: Port (Single)    BSA and dose double checked (agree with orders as written) with: yes, see MAR    Labs/applicable tests checked: Echocardiogram    Chemo regimen: C13D1 Kanjinti    trastuzumab-anns (KANJINTI) 466.2 mg in sodium chloride 0.9% (NS) 272.2 mL IVPB    Rate verified and armband double check with second RN: yes    Patient education offered and stated understanding. Denies questions at this time.    Patient arrived to CC treatment. VSS, port positive for blood return, and assessment complete; please refer to doc flowsheet for details. Kanjinti given w/o complications, patient tolerated well. Port positive for blood return, flushed with saline, and deaccessed per protocol. Patient declined copy of labs and AVS. All questions and concerns addressed. Patient left CC treatment in stable condition.

## 2022-10-27 ENCOUNTER — Encounter: Admit: 2022-10-27 | Discharge: 2022-10-27 | Payer: BC Managed Care – PPO

## 2022-10-27 DIAGNOSIS — K219 Gastro-esophageal reflux disease without esophagitis: Secondary | ICD-10-CM

## 2022-10-27 DIAGNOSIS — C50919 Malignant neoplasm of unspecified site of unspecified female breast: Secondary | ICD-10-CM

## 2022-10-27 DIAGNOSIS — D0511 Intraductal carcinoma in situ of right breast: Secondary | ICD-10-CM

## 2022-10-27 DIAGNOSIS — C50411 Malignant neoplasm of upper-outer quadrant of right female breast: Secondary | ICD-10-CM

## 2022-10-27 DIAGNOSIS — J45909 Unspecified asthma, uncomplicated: Secondary | ICD-10-CM

## 2022-10-27 DIAGNOSIS — Z79811 Long term (current) use of aromatase inhibitors: Secondary | ICD-10-CM

## 2022-10-27 MED ORDER — ANASTROZOLE 1 MG PO TAB
1 mg | ORAL_TABLET | Freq: Every day | ORAL | 3 refills | 33.00000 days | Status: AC
Start: 2022-10-27 — End: ?

## 2022-10-27 MED ORDER — TRASTUZUMAB-ANNS IVPB
6 mg/kg | Freq: Once | INTRAVENOUS | 0 refills | Status: CP
Start: 2022-10-27 — End: ?
  Administered 2022-10-27 (×2): 466.2 mg via INTRAVENOUS

## 2022-10-27 NOTE — Progress Notes
Patient arrived to [place] treatment after being seen in clinic with Blanchard Kelch, APRN; please refer to clinic note for assessment details. C14D1 Kanjinti given w/o complications, patient tolerated well. Port positive for blood return, flushed with saline, and deaccessed per protocol. Patient declined copy of labs and AVS. All questions and concerns addressed. Patient left CC treatment w/ all personal belongings in stable condition.    CHEMO NOTE  Verified chemo consent signed and in chart.    Blood return positive via: Port (Power Port)    BSA and dose double checked (agree with orders as written) with: yes, see MAR    Labs/applicable tests checked: Echocardiogram    Chemo regimen: Drug/cycle/day C14D1 Kanjinti  trastuzumab-anns (KANJINTI) 466.2 mg in sodium chloride 0.9% (NS) 272.2 mL IVPB     Rate verified and armband double check with second RN: yes, see MAR    Patient education offered and stated understanding. Denies questions at this time.

## 2022-12-18 ENCOUNTER — Encounter: Admit: 2022-12-18 | Discharge: 2022-12-18 | Payer: BC Managed Care – PPO

## 2022-12-22 ENCOUNTER — Ambulatory Visit: Admit: 2022-12-22 | Discharge: 2022-12-22 | Payer: BC Managed Care – PPO

## 2022-12-22 ENCOUNTER — Encounter: Admit: 2022-12-22 | Discharge: 2022-12-22 | Payer: BC Managed Care – PPO

## 2022-12-22 DIAGNOSIS — R921 Mammographic calcification found on diagnostic imaging of breast: Secondary | ICD-10-CM

## 2022-12-22 DIAGNOSIS — Z08 Encounter for follow-up examination after completed treatment for malignant neoplasm: Secondary | ICD-10-CM

## 2022-12-22 DIAGNOSIS — C50919 Malignant neoplasm of unspecified site of unspecified female breast: Secondary | ICD-10-CM

## 2022-12-22 DIAGNOSIS — C50411 Malignant neoplasm of upper-outer quadrant of right female breast: Secondary | ICD-10-CM

## 2022-12-22 DIAGNOSIS — Z923 Personal history of irradiation: Secondary | ICD-10-CM

## 2022-12-22 DIAGNOSIS — Z9889 Other specified postprocedural states: Secondary | ICD-10-CM

## 2022-12-22 DIAGNOSIS — Z1231 Encounter for screening mammogram for malignant neoplasm of breast: Secondary | ICD-10-CM

## 2022-12-22 DIAGNOSIS — K219 Gastro-esophageal reflux disease without esophagitis: Secondary | ICD-10-CM

## 2022-12-22 DIAGNOSIS — J45909 Unspecified asthma, uncomplicated: Secondary | ICD-10-CM

## 2022-12-22 DIAGNOSIS — D0511 Intraductal carcinoma in situ of right breast: Secondary | ICD-10-CM

## 2022-12-22 DIAGNOSIS — Z9221 Personal history of antineoplastic chemotherapy: Secondary | ICD-10-CM

## 2023-01-26 ENCOUNTER — Encounter: Admit: 2023-01-26 | Discharge: 2023-01-26 | Payer: BC Managed Care – PPO

## 2023-01-26 DIAGNOSIS — E041 Nontoxic single thyroid nodule: Secondary | ICD-10-CM

## 2023-01-26 DIAGNOSIS — C50411 Malignant neoplasm of upper-outer quadrant of right female breast: Secondary | ICD-10-CM

## 2023-01-26 DIAGNOSIS — Z923 Personal history of irradiation: Secondary | ICD-10-CM

## 2023-01-26 DIAGNOSIS — J45909 Unspecified asthma, uncomplicated: Secondary | ICD-10-CM

## 2023-01-26 DIAGNOSIS — Z9221 Personal history of antineoplastic chemotherapy: Secondary | ICD-10-CM

## 2023-01-26 DIAGNOSIS — D0511 Intraductal carcinoma in situ of right breast: Secondary | ICD-10-CM

## 2023-01-26 DIAGNOSIS — C50919 Malignant neoplasm of unspecified site of unspecified female breast: Secondary | ICD-10-CM

## 2023-01-26 DIAGNOSIS — Z79811 Long term (current) use of aromatase inhibitors: Secondary | ICD-10-CM

## 2023-01-26 DIAGNOSIS — K219 Gastro-esophageal reflux disease without esophagitis: Secondary | ICD-10-CM

## 2023-01-26 NOTE — Progress Notes
Spoke with patient she declined to the recommended thyroid biopsy  wants to repeat the ultrasound in 6 months    Request to scheduling to assist with that appointment

## 2023-01-31 ENCOUNTER — Encounter: Admit: 2023-01-31 | Discharge: 2023-01-31 | Payer: BC Managed Care – PPO

## 2023-02-07 ENCOUNTER — Encounter: Admit: 2023-02-07 | Discharge: 2023-02-07 | Payer: BC Managed Care – PPO

## 2023-02-07 DIAGNOSIS — Z9221 Personal history of antineoplastic chemotherapy: Secondary | ICD-10-CM

## 2023-02-07 DIAGNOSIS — C50411 Malignant neoplasm of upper-outer quadrant of right female breast: Secondary | ICD-10-CM

## 2023-02-07 DIAGNOSIS — K219 Gastro-esophageal reflux disease without esophagitis: Secondary | ICD-10-CM

## 2023-02-07 DIAGNOSIS — D0511 Intraductal carcinoma in situ of right breast: Secondary | ICD-10-CM

## 2023-02-07 DIAGNOSIS — J45909 Unspecified asthma, uncomplicated: Secondary | ICD-10-CM

## 2023-02-07 DIAGNOSIS — C50919 Malignant neoplasm of unspecified site of unspecified female breast: Secondary | ICD-10-CM

## 2023-02-07 DIAGNOSIS — Z923 Personal history of irradiation: Secondary | ICD-10-CM

## 2023-02-07 NOTE — Progress Notes
Name: Kristy Martinez          MRN: [id number]      DOB: [birth year]      AGE: 62 y.o.   DATE OF SERVICE: 02/07/2023    Subjective:             Reason for Visit:  Follow Up      Kristy Martinez is a 62 y.o. female.      Cancer Staging   Malignant neoplasm of upper-outer quadrant of right breast in female, estrogen receptor negative  (HCC)  Staging form: Breast, AJCC 8th Edition  - Clinical stage from 08/17/2021: Stage 0 (cTis (DCIS), cN0, cM0, ER+, PR+, HER2: Not Assessed) - Signed by Scarlett Presto, PA-C on 08/17/2021  - Pathologic stage from 09/05/2021: Stage IA (pT1b, pN0, cM0, G2, ER-, PR-, HER2+) - Signed by Scarlett Presto, PA-C on 09/21/2021      History of Present Illness    DIAGNOSIS: right breast cancer   PAST ONCOLOGY HISTORY: Kristy Martinez is a 62 year old post menopausal female who underwent a routine screening mammogram which revealed a focal asymmetry with associated microcalcifications in the right breast.  Diagnostic mammogram showed a persistent asymmetry with associated fine pleomorphic calcifications in aggregate measuring approximately 2.3 cm.  There was no ultrasound correlate, therefore stereotactic right breast biopsy was performed on 08/11/21 ([place]). 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 On 08/29/2021 Dr Cyril Mourning performed a right lumpectomy at [facility]. Pathology showed IDC, grade II, measuring 0.8cm, ER<1%, PR 0%, HER2 3+ IHC. On 09/19/2021 Dr Cyril Mourning performed a right ALNB at [facility]. Pathology showed 0 LN involved (0/3).     She completed adjuvant Taxol every week x12/Herceptin every 3 weeks; completed 01/11/2022. Continue one year of Herceptin completed 10/27/2022.   She completed radiation with Dr Isabella Stalling 02/2022.    BREAST IMAGING:  Mammogram:    -- Bilateral screening mammogram 07/22/21 ([place]) revealed no suspicious abnormality is seen in the left breast. In the upper outer right breast, middle/posterior depth, there is a focal asymmetry with associated microcalcifications (MLO slice 30 and CC slice 38). Recommend right diagnostic mammogram and targeted ultrasound for further evaluation.   -- Right diagnostic mammogram 08/08/21 ([place]) revealed a persistent 1.5 cm focal asymmetry with subtle architectural distortion within the upper outer right breast middle-posterior depth at approximately 10:00. The focal asymmetry has associated fine pleomorphic calcifications in aggregate measuring approximately 2.0 x 1.5 x 2.3 cm in the AP, TV and CC dimensions.   Ultrasound:    -- Targeted right breast ultrasound 08/08/21 ([place]) revealed no convincing ultrasound correlate is identified for the mammographic focal asymmetry of concern. 5 morphologically normal appearing right axillary lymph nodes are documented. Impression: Suspicious focal asymmetry with associated calcifications in the upper outer right breast at 10:00. Stereotactic/tomosynthesis guided biopsy is recommended.     CXR 09/19/2021:    Left chest port with the tip overlying the mid SVC. Small nodular opacity overlying the right costophrenic angle. Repeat 2 view chest radiographs with nipple markers is recommended. If this persists on follow-up imaging, CT chest is recommended for better characterization.     OB/GYN HISTORY: Age at onset of menstruation 5. G21P1. She had her first child at age 58. LMP 43; uterus and ovaries intact. She never took HRT.     PRESENT THERAPY: Arimidex 1mg  daily started 10/2022.     Kristy Salvino is here today for EOT visit.         Review of  Systems   Genitourinary:         Post menopausal   Uterus and ovaries intact        No Known Allergies    Objective:          acetaminophen (TYLENOL) 325 mg tablet Take two tablets by mouth every 6 hours as needed for Pain. Indications: take two tabs every 6 hours for the first three days after surgery, then take as needed    anastrozole (ARIMIDEX) 1 mg tablet Take one tablet by mouth daily.    cetirizine HCl (ZYRTEC PO) Take  by mouth.    citalopram (CELEXA) 20 mg tablet Take one tablet by mouth daily.    FLOVENT HFA 110 mcg/actuation inhaler     LORazepam (ATIVAN) 0.5 mg tablet Take one tablet by mouth every 8 hours as needed for Nausea, Vomiting or Other... (insomnia). Max Daily Amount: 1.5 mg.    omeprazole DR (PRILOSEC) 20 mg capsule      Vitals:    02/07/23 1301   PainSc: Zero     There is no height or weight on file to calculate BMI.     Pain Score: Zero       Fatigue Scale: 0-None    Pain Addressed:  N/A    Patient Evaluated for a Clinical Trial: No treatment clinical trial available for this patient.     [place] Cooperative Oncology Group performance status is 0, Fully active, able to carry on all pre-disease performance without restriction.     Physical Exam  Vitals and nursing note reviewed. Exam conducted with a chaperone present.   Pulmonary:      Effort: No respiratory distress.      Comments: No breast exam today due to telehealth   Chest:   Breasts:     Right: No inverted nipple, mass, nipple discharge, skin change or tenderness.      Left: No inverted nipple, mass, nipple discharge, skin change or tenderness.       Lymphadenopathy:      Cervical: No cervical adenopathy.      Upper Body:      Right upper body: No supraclavicular or axillary adenopathy.      Left upper body: No supraclavicular or axillary adenopathy.          RADIOLOGY:  ECHO 10/05/2021: EF 65%  ECHO 05/01/2022: EF 57%  ECHO 07/31/2022: EF 64%    CXR 09/19/2021: Left chest port with the tip overlying the mid SVC. Small nodular opacity overlying the right costophrenic angle. Repeat 2 view chest radiographs with nipple markers is recommended. If this persists on follow-up imaging, CT chest is recommended for better characterization.     CXR 10/12/2021:Dense nodular opacity at the right lung base just lateral to the nipple marker probably represents a calcified granuloma. However, given recent diagnosis of breast cancer, consider further evaluation with chest CT.     CT chest 11/02/2021:  1. Calcified granuloma within the right lower lobe, corresponding to the nodular opacity seen on recent chest radiograph.   2. Several scattered tiny nonspecific pulmonary nodules. Follow-up CT chest in 6 months is recommended for reevaluation.   3. 1.6 cm left thyroid nodule. Dedicated thyroid ultrasound is recommended for further evaluation.       BMD 07/04/2022: Osteopenia (T-score -2.0)  BMD 01/26/2023: Osteopenia (T-score -2.0)    Bilateral diagnostic mammogram 06/20/2022: BIRAD 3-probably benign (right diagnostic mammogram in 6 months)   Right diagnostic mammogram 12/22/2022: BIRAD 3  Neck ultrasound 05/24/2022:  Isthmus: Normal in thickness. No discrete nodule.   Right lobe: Measures 3.5 x 1.3 x 1.4 cm.  The right lobe parenchyma is mildly heterogeneous. No discrete thyroid nodules are identified.     Left lobe: Measures 3.1 x 1.7 x 2.0 cm.  The left lobe background parenchyma is mildly heterogeneous. One nodule is measured.   *  Mid: Solid, isoechoic, smooth margins, wider than tall, and punctate echogenic foci. Measures 2.0 x 1.5 x 1.8 cm. TR 4   Normal size bilateral cervical lymph nodes are noted, which are most likely reactive in nature. No lymph nodes demonstrate calcification or cystic change.     Thyroid ultrasound 01/26/2023:  1.  Stable TR 4 nodule in the left thyroid lobe which meets ACR-TIRADS criteria for percutaneous biopsy.   2.  No suspicious cervical lymphadenopathy.      Treatment Summary for Malignant neoplasm of upper-outer quadrant of right breast in female, estrogen receptor negative  (HCC)       Kristy Martinez, Kristy Martinez, Kristy Martinez  02/07/2023  1:17 PM        Cancer Treatment Summary  Provided by Myrla Halsted, Kristy Martinez on 01/17/2023       General Information   Patient Name Latreese Dotterer   Patient ID [id number]   Phone [phone] (home)    Date of birth [birth year]   Age 29 y.o.   Support Contact Extended Emergency Contact Information  Primary Emergency Contact: Calo,Charles  Address: [address]           [place], [place] [address] [employer]  Home Phone: [phone]  Mobile Phone: [phone]  Relation: Spouse  Secondary Emergency Contact: Lucretia Roers  Address: [phone] [address]           [place] [id number] [place]  Home Phone: [phone]  Mobile Phone: [phone]  Relation: Daughter         Care Team   Patient Care Team:  Cicero, [place], [place] as PCP - General (Physician Assistant)  [place], [place], [place] as REFERRING MD (Physician Assistant)  [place], [place], [place] as Consulting Physician (Physician Assistant)  Hoyt Koch, MD (Family Medicine)      Cancer Diagnosis Information   Symptoms/Signs Abnormal screening mammogram    Diagnosis Right breast IDC   Diagnosis Date Right breast biopsy 08/11/2021   Staging Information Cancer Staging   Malignant neoplasm of upper-outer quadrant of right breast in female, estrogen receptor negative  (HCC)  Staging form: Breast, AJCC 8th Edition  - Clinical stage from 08/17/2021: Stage 0 (cTis (DCIS), cN0, cM0, ER+, PR+, HER2: Not Assessed) - Signed by Scarlett Presto, PA-C on 08/17/2021  - Pathologic stage from 09/05/2021: Stage IA (pT1b, pN0, cM0, G2, ER-, PR-, HER2+) - Signed by Scarlett Presto, PA-C on 09/21/2021     Tumor & Prognostic Markers DCIS: ER 70%  IDC: ER <1% and HER2 3+ IHC positive        Surgical Procedure: Location/Findings On 08/29/2021 Dr Cyril Mourning performed a right lumpectomy at [facility]. Pathology showed IDC, grade II, measuring 0.8cm. On 09/19/2021 Dr Cyril Mourning performed a right ALNB at [facility]. Pathology showed 0 LN involved (0/3).              Background Information   Family History/predisposing conditions Family History   Problem Relation Age of Onset    Arthritis-osteo Mother     Thyroid Disease Father     Cancer-Breast Paternal Aunt 73    Cancer-Breast Maternal Grandmother  60s    Cancer-Prostate Maternal Grandfather     Diabetes Paternal Grandmother           Social History Social History     Tobacco Use    Smoking status: Never    Smokeless tobacco: Never   Vaping Use    Vaping status: Never Used   Substance Use Topics    Alcohol use: Yes     Alcohol/week: 1.0 standard drink of alcohol     Types: 1 Glasses of wine per week     Comment: monthly    Drug use: Not Currently     Vaping/E-liquid Use    Vaping Use Never User                      Treatment Summary   Radiation Therapy completed with Dr Isabella Stalling on 03/02/2022      CHEMOTHERAPY:    TAXOL: 80mg /m2 weekly x12 started 10/26/2021 and completed 01/11/2022    HERCEPTIN: every 3 weeks started 10/26/2021 and completed 10/27/2022      ENDOCRINE THERAPY: Arimidex 1mg  daily started 10/27/2022         Follow-Up & Survivorship Care   SURVEILLANCE   1. Physical exam  Visit your Medical Oncologist every 3-6 months for the first 2 years after treatment then every 6-12 months for the next 3 years  Annual mammogram and clinical breast or chest wall exam  Continue to follow with your PCP once a year   Monthly self-breast exam  2. Signs to watch for that could indication a local or distant recurrence of breast cancer   A new lump in the breast or irregular area of firmness  A new thickening in your breast   A new pulling back of the skin or dimpling at the lumpectomy site  Skin inflammation, rash or area of redness in the breast  Changes or new lumps in your breast or surgical scar or chest wall  Flattening or indentation of your nipple or other nipple changes like nipple discharge   A lump or swelling in the lymph nodes under your arm or in the groove above your collarbone  Pain in the back, hips, chest shoulders or ribs  Persistent cough  Shortness of breath or difficulty breathing   Loss of appetite  Persistent nausea, vomiting or weight loss  Swelling in the abdomen or pain in the abdomen   New headaches  3. Pelvic exam   Continue to visit a gynecologist regularly   4. Bone health  Bone Mineral Density  every 1-2 years  5. Colonoscopy  Baseline at age 47 and follow with PCP  6. Exercise/Weight management/Diet  Strive to maintain close to ideal body weight Exercise at least 150 minutes per week with a combination of cardio and strength training  Maintain a healthy diet full of fresh fruits and vegetables  Vit D 1,000U-2,000U/daily unless  instructed otherwise   Consume alcohol in moderation  7. Lymphedema  Contact your provider if new swelling occurs                                  Assessment and Plan:  1. Kristy Wacht is a 62 year old post menopausal female with right breast IDC, grade II, measuring 0.8cm, ER/PR negative and HER2 3+ positive. S/P right lumpectomy and SLNB with 0 LN involved. She was seen initially by Dr Arliss Journey.   2. We have  reviewed the patient's radiology and pathology results in detail with her and her husband.  We have reviewed the particular features of her cancer including stage, grade, hormone receptor and HER2 neu status.   3. Due to her cancer being HER2 positive; we are recommending adjuvant chemotherapy with Taxol weekly x12 and Herceptin every 3 weeks for one year. Completed 12 cycles 01/11/2022. She continued with Herceptin every 3 weeks for 1 year; 10/27/2022.    4. Repeat thyroid ultrasound due to CT chest 11/02/2021 showed 1.6 cm left thyroid nodule. Dedicated thyroid ultrasound is recommended for further evaluation.  Neck ultrasound recommended thyroid biopsy. She is not interested in biopsy at this time. Repeat thyroid ultrasound due 07/2023.    5. Bilateral mammogram to be followed by Dr Cyril Mourning. Due 06/2023.   6. She completed radiation with Dr Isabella Stalling 02/2022.     7. Her IDC was ER/PR negative. DCIS was ER+. We will recommend Arimidex 1mg  daily for 5 years; started 10/2022. She was again told the side effects including joint pain and bone loss.   8. Osteopenia; repeat BMD 01/2024. She will continue calcium nd Vitamin D.   9. RTC with Dr Welton Flakes 07/2023.       My collaborating MD Dr Raul Del.     Total Time Today was 45 minutes in the following activities: Preparing to see the patient, Obtaining and/or reviewing separately obtained history, Performing a medically appropriate examination and/or evaluation, Counseling and educating the patient/family/caregiver, Ordering medications, tests, or procedures, and Documenting clinical information in the electronic or other health record    Myrla Halsted, Kristy Martinez

## 2023-04-06 NOTE — Progress Notes
Radiation Oncology Follow Up Note  Date: 04/09/2023       Kristy Martinez is a 62 y.o. female.     There were no encounter diagnoses.  Staging:  Cancer Staging   Malignant neoplasm of upper-outer quadrant of right breast in female, estrogen receptor negative (HCC)  Staging form: Breast, AJCC 8th Edition  - Clinical stage from 08/17/2021: Stage 0 (cTis (DCIS), cN0, cM0, ER+, PR+, HER2: Not Assessed) - Signed by Scarlett Presto, PA-C on 08/17/2021  - Pathologic stage from 09/05/2021: Stage IA (pT1b, pN0, cM0, G2, ER-, PR-, HER2+) - Signed by Scarlett Presto, PA-C on 09/21/2021      History of Present Illness  Ms. Kristy Mannan. Martinez is a 62 y.o. female with oncologic history as follows:     - 07/22/2021 ([place]) bilateral screening mammogram: No suspicious abnormality in the left breast.  In the upper outer right breast, middle posterior depth there is a focal asymmetry with associated microcalcifications.  - 08/08/2021 ([place]) right diagnostic mammogram: Persistent 1.5 cm focal asymmetry with subtle architectural distortion in the upper outer right breast middle posterior depth at approximately 10:00, with associated fine pleomorphic calcifications.  The calcifications in aggregate measure approximately 2.0 x 1.5 x 2.3 cm.  - 08/08/2021 ([place]) targeted right breast ultrasound: No convincing ultrasound correlate identified for mammographic asymmetry of concern.  5 morphologically normal-appearing right axillary lymph nodes.  Suspicious focal asymmetry with associated calcifications upper outer right breast 10:00, biopsy recommended.  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  -08/29/2021 right radioactive seed localized lumpectomy.  The final pathology showing a small focus of invasive ductal carcinoma, grade 2 with extensive intraductal component, high nuclear grade.  ER less than 1%, PR 0, HER2 3+.  -On 09/19/2021 the patient underwent right axillary sentinel node biopsy.  Final pathology confirmed 0 of 3 lymph nodes to be involved with metastatic carcinoma.     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 She tolerated treatment well and had no treatment interruptions.       03/02/2022   Course ID C1 [id number] [id number]   First Treatment Date 02-09-2022 02:10PM   Last Treatment Date 03-02-2022 03:24PM   Treatment Elapsed Days 21   Reference Point ID RtBreast   Dosage Given To Date 42.56   Session Dosage Given 2.66   Plan ID [id number]   Fractions Treated to Date 16   Total Fractions on Plan 16   Prescribed Dose per Fraction 2.66   Prescription Dose 4,256        Subjective:       Kristy Martinez returns today for routine follow-up. She is accompanied by her husband to today's visit. Doing well. She retired in December and is really enjoying having more free time. She feels she is back to all of her normal activities since undergoing cancer treatment. No breast pain or tenderness. Range of motion of her right arm is good. Patient denies shortness of air, cough or chills. Patient denies neurologic symptoms such as headache, dizziness or vision changes. Denies fatigue, though is appropriately tired after a busy day. After retiring, she thoroughly deep cleaned her home and now is happy to have time to do yard work/landscaping with her husband.  She has also taken a few road trips recently.          Review of Systems   Constitutional:  Negative for fatigue and fever.   HENT:  Negative for sore throat and trouble swallowing.    Respiratory:  Negative for cough and shortness of breath.    Cardiovascular:  Negative for chest pain.   Musculoskeletal:  Negative for arthralgias and myalgias.   Skin:  Positive for color change (treated right breast).   Neurological:  Negative for light-headedness and headaches.   Hematological:  Negative for adenopathy.   Psychiatric/Behavioral:  The patient is not nervous/anxious.    All other systems reviewed and are negative.      Objective:          acetaminophen (TYLENOL) 325 mg tablet Take two tablets by mouth every 6 hours as needed for Pain. Indications: take two tabs every 6 hours for the first three days after surgery, then take as needed    anastrozole (ARIMIDEX) 1 mg tablet Take one tablet by mouth daily.    citalopram (CELEXA) 20 mg tablet Take one tablet by mouth daily.    FLOVENT HFA 110 mcg/actuation inhaler     LORazepam (ATIVAN) 0.5 mg tablet Take one tablet by mouth every 8 hours as needed for Nausea, Vomiting or Other... (insomnia). Max Daily Amount: 1.5 mg.    omeprazole DR (PRILOSEC) 20 mg capsule      Vitals:    04/09/23 1115   BP: (!) 141/79   BP Source: Arm, Left Upper   Pulse: 77   Temp: 36.6 ?C (97.9 ?F)   Resp: 18   SpO2: 97%   PainSc: Zero   Weight: 78 kg (172 lb)     Body mass index is 31.46 kg/m?Marland Kitchen     Pain Score: Zero             KARNOFSKY PERFORMANCE SCORE:  100% Normal, no complaints     Physical Exam  Vitals reviewed.   Constitutional:       General: She is awake. She is not in acute distress.     Appearance: Normal appearance. She is well-developed. She is not ill-appearing.   HENT:      Head: Normocephalic and atraumatic.      Right Ear: External ear normal.      Left Ear: External ear normal.   Eyes:      General: Lids are normal.      Extraocular Movements: Extraocular movements intact.      Conjunctiva/sclera: Conjunctivae normal.   Cardiovascular:      Rate and Rhythm: Normal rate and regular rhythm.      Heart sounds: Normal heart sounds.   Pulmonary:      Effort: Pulmonary effort is normal. No respiratory distress.      Breath sounds: Normal breath sounds.   Chest:      Comments: Hyperpigmentation and mild dependent edema of the right breast. No other palpable abnormalities, nipple or areolar changes in either breast. Musculoskeletal:      Cervical back: Neck supple.   Lymphadenopathy:      Cervical: No cervical adenopathy.      Upper Body:      Right upper body: No supraclavicular or axillary adenopathy.      Left upper body: No supraclavicular or axillary adenopathy.   Skin:     General: Skin is warm and dry.      Coloration: Skin is not pale.   Neurological:  General: No focal deficit present.      Mental Status: She is alert and oriented to person, place, and time. Mental status is at baseline.      Cranial Nerves: Cranial nerves 2-12 are intact. No cranial nerve deficit.      Sensory: Sensation is intact.      Motor: Motor function is intact.   Psychiatric:         Attention and Perception: Attention normal.         Mood and Affect: Mood and affect normal.         Speech: Speech normal.         Behavior: Behavior normal.         Cognition and Memory: Cognition and memory normal.            Laboratory:    Comprehensive Metabolic Profile    Lab Results   Component Value Date/Time    NA 140 01/11/2022 01:31 PM    K 3.6 01/11/2022 01:31 PM    CL 104 01/11/2022 01:31 PM    CO2 31 (H) 01/11/2022 01:31 PM    GAP 5 01/11/2022 01:31 PM    BUN 13 01/11/2022 01:31 PM    CR 0.8 05/24/2022 08:06 AM    CR 0.72 01/11/2022 01:31 PM    GLU 136 (H) 01/11/2022 01:31 PM    Lab Results   Component Value Date/Time    CA 8.6 01/11/2022 01:31 PM    ALBUMIN 3.8 01/11/2022 01:31 PM    TOTPROT 6.3 01/11/2022 01:31 PM    ALKPHOS 64 01/11/2022 01:31 PM    AST 16 01/11/2022 01:31 PM    ALT 18 01/11/2022 01:31 PM    TOTBILI 0.3 01/11/2022 01:31 PM        CBC w diff    Lab Results   Component Value Date/Time    WBC 5.7 01/11/2022 01:31 PM    RBC 3.61 (L) 01/11/2022 01:31 PM    HGB 11.3 (L) 01/11/2022 01:31 PM    HCT 33.6 (L) 01/11/2022 01:31 PM    MCV 93.1 01/11/2022 01:31 PM    MCH 31.4 01/11/2022 01:31 PM    MCHC 33.7 01/11/2022 01:31 PM    RDW 16.9 (H) 01/11/2022 01:31 PM    PLTCT 360 01/11/2022 01:31 PM    MPV 7.3 01/11/2022 01:31 PM    Lab Results   Component Value Date/Time    NEUT 66 01/11/2022 01:31 PM    ANC 3.80 01/11/2022 01:31 PM    LYMA 23 (L) 01/11/2022 01:31 PM    ALC 1.30 01/11/2022 01:31 PM    MONA 6 01/11/2022 01:31 PM    AMC 0.30 01/11/2022 01:31 PM    EOSA 4 01/11/2022 01:31 PM    AEC 0.20 01/11/2022 01:31 PM    BASA 1 01/11/2022 01:31 PM    ABC 0.10 01/11/2022 01:31 PM          Imaging:   US THYROID  Narrative: ULTRASOUND OF THE NECK    CLINICAL INDICATION: Female, 61 years;  left thyroid nodule    TECHNIQUE: Multiple grayscale and color Doppler ultrasound images were obtained of the thyroid gland and anterior neck.    COMPARISON: Thyroid ultrasound 05/24/2022.     FINDINGS:    Isthmus: Normal in thickness. No discrete nodule.    Right lobe: Measures 3.8 x 1.1 x 1.4 cm.  The right lobe parenchyma is mildly heterogeneous. No thyroid nodules are identified.      Left lobe: Measures  3.4 x 1.8 x 2.3 cm.  The left lobe parenchyma is mildly heterogeneous. One nodule is again measured.  *  Mid lobe: Solid, isoechoic, wider than tall shape, smooth margins, punctate echogenic foci. Measures 2.2 x 1.5 x 1.8 cm, previously measuring 2.0 x 1.5 x 1.8 cm. TR 4      A few normal-sized cervical lymph nodes are noted, which are most likely reactive in nature. No lymph nodes demonstrate calcification or cystic change.  Impression: 1.  Stable TR 4 nodule in the left thyroid lobe which meets ACR-TIRADS criteria for percutaneous biopsy.  2.  No suspicious cervical lymphadenopathy.    #FOLLOW    ACR TI-RADS assessment categories  TR1: Benign  TR2: Not suspicious   TR3: Mildly suspicious  TR4: Moderately suspicious  TR5: Highly suspicious    [email or web address]    By my electronic signature, I attest that I have personally reviewed the images for this examination and formulated the interpretations and opinions expressed in this report     Finalized by Jake Bathe, M.D. on 01/26/2023 9:15 AM. Dictated by Darden Dates, MD on 01/26/2023 9:04 AM.  BONE DENSITY SPINE/HIP  Narrative: BONE DENSITOMETRY     CLINICAL INDICATION:  62 year old female, aromatase inhibitor use. Malignant neoplasm of upper outer quadrant of right breast in female, estrogen receptor positive.       COMPARISON: 07/04/2022    FINDINGS: DEXA scan of the lumbar spine and bilateral hips were performed with GE Lunar Prodigy Advance. FRAX score was calculated from patient reported risk factors and femoral neck bone density.    LUMBAR SPINE, L1-L4  Current: 1.096 g/cm2, T-score of -0.8   Previous: 1.121 g/cm2, T-score of -0.6    There are however hypertrophic degenerative changes of the lumbar spine which could artificially elevate the bone mineral density.    LEFT FEMORAL NECK    Current: 0.790 g/cm2, T-score of -1.8   Previous: 0.801 g/cm2, T-score of -1.7    LEFT TOTAL HIP    Current: 0.837 g/cm2, T-score of -1.4   Previous: 0.863 g/cm2, T-score of -1.2    RIGHT FEMORAL NECK  Current: 0.755 g/cm2, T-score of -2.0   Previous: 0.766 g/cm2, T-score of -2.0    RIGHT TOTAL HIP  Current: 0.825 g/cm2, T-score of -1.5   Previous: 0.845 g/cm2, T-score of -1.3      FRAX SCORE   10 year risk hip fracture = 1.4%   10 year risk major osteoporotic fracture = 10.1%    WHO Criteria for Diagnosis of Osteoporosis (T-score)*  Normal (-1.0 and above)  Low bone mass, referred to as osteopenia (Between -1.0 and -2.5)  Osteoporosis (-2.5 and below)    *Based on region with lowest bone mineral density.   Impression: Statistically stable bone mineral density of lumbar spine and both hips. Persistent low bone mass (osteopenia).    General comments regarding interpretation of bone mineral density measurements:    a) Consider FDA-approved medical therapies in the setting of 1) Hip or vertebral fracture, 2) Osteoporosis, and 3) low bone mass, referred to as osteopenia, with FRAX score of greater than or equal to 3% for hip fracture or greater than or equal to 20% for major osteoporotic fracture. Treatment may also be indicated based on clinical judgement and/or patient preference.     b) Interval between BMD testing should be determined according to each patient's clinical status: typically one year after initiation or change of therapy is appropriate, with longer intervals once therapeutic effect  is established. In conditions with rapid bone loss, such as glucocorticoid therapy, testing more frequently is appropriate.      Finalized by Particia Jasper, M.D. on 01/26/2023 8:15 AM. Dictated by Particia Jasper, M.D. on 01/26/2023 8:13 AM.       Path:  PATHOLOGY REPORT   Date Value Ref Range Status   09/19/2021   Final    [facility]  [email or web address]    Department of Pathology and Laboratory Medicine  [address], [place], [place] [address]  Surgical Pathology Office:  [phone]  Fax:  [phone]  SURGICAL PATHOLOGY REPORT    NAME: SHARONNA, KARRER SURG PATH #: [id number] MR #: [id number] SPECIMEN  CLASS: SI BILLING #: [id number] ALT ID #:  LOCATION: [id number] DATE OF  PROCEDURE: 09/19/2021 AGE:  60 SEX: F DATE RECEIVED: 09/19/2021 DOB:  [birth year]  TIME RECEIVED:  11:02 PHYSICIAN: Ignatius Specking, MD DATE OF  REPORT: 09/21/2021 COPY TO:  DATE OF PRINTING: 09/21/2021         ########################################################################  Final Diagnosis:    A. Lymph nodes, right axillary sentinel lymph nodes, biopsy:    Three lymph nodes, negative for metastatic carcinoma (0/3).  Deeper sections and pancytokeratin staining support the absence of  carcinoma.       Attestation:  By this signature, I attest that I have personally formulated the final  interpretation expressed in this report and that the above diagnosis is  based upon my examination of the slides and/or other material indicated in  this report.    +++Electronically Signed Out By Murvin Natal, MD on 09/21/2021+++             bm/09/19/2021 ########################################################################  Material Received:  A: right axillary sentinel lymph nodes    History:  62 year old female with a history of ductal carcinoma in situ of right  breast.         Gross Description:  A. Received fresh, labeled with the patient's name and right axillary  sentinel lymph nodes, are three tan-yellow lymph nodes measuring 0.7-1.2  cm in greatest dimension. The specimen is entirely submitted as follows:  A1      2 possible lymph nodes, bisected (inked black and blue).   A2      1 possible lymph node, serially sectioned.   A3      Remainder of fibroadipose tissue with possible minute lymph nodes.  (ket)    kp/09/19/2021           If immunohistochemical stains and/or in situ hybridization are cited in  this report, the performance characteristics were determined by the  Department of Pathology and Laboratory Medicine of the [facility]  [facility]) in compliance with CLIA'88  regulations. Some of these tests rely on the use of analyte specific  reagents and are subject to specific labeling requirements by the FDA.  The stains are performed on formalin-fixed, paraffin-embedded tissue,  unless otherwise stated. Known positive and negative control tissues  demonstrate appropriate staining. Results should be interpreted with  caution given the likelihood of false negativity on decalcified specimens.  This testing was developed by the Department of Pathology and Laboratory  Medicine of the [place] [facility] [place]. It has not been cleared or approved  by the FDA.  The FDA has determined that such clearance or approval is not  necessary.    Professional services performed by [employer] of [address], [address], at [address], [place], [place]  [address]       Assessment and Plan:   Ms. Shauntavia Eckland is a 62 y.o. 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  She status post breast conserving surgery without axillary lymph node sampling on 08/29/2021.  She was found to have invasive ductal carcinoma ER less than 1% PR 0%, HER2 3+.  On 09/19/2021 she had right axillary sentinel lymph node biopsy which confirmed a 0 of 3 sentinel nodes containing metastatic disease.  She received chemotherapy and completed 1 year of Herceptin. She underwent adjuvant radiotherapy to her right breast to a dose of 4256 cGy in 16 fractions from 02/09/2022 - 03/02/2022.     - Malon Kindle was seen today for routine follow-up after completion of radiation therapy.  At this time, she is clinically stable with no clinical evidence of disease recurrence or progression.  She has no subacute toxicities from having undergone adjuvant radiation, aside from some residual hyperpigmentation of her right breast. Mild lymphedema of right breast on exam; denies bothersome symptoms.   - bilateral mammogram 06/2022 with right breast calcifications; follow-up mammogram 12/2022 with unchanged appearance and likely post-treatment related. Bilateral mammogram scheduled in August.   - Our plan will be for the patient to return to our clinic in 1 year for continued follow-up.  She may contact us in the interim if there are any questions or concerns requiring earlier assessment.  -The patient will keep their appointments with their other managing providers including medical oncology and breast surgery. She continues on anastrozole.          Total time for today's visit was 25 minutes. Visit time spent on the following: preparing to see the patient, obtaining and/or reviewing separately obtained history/information, performing a physical examination and/or evaluation, counseling and educating the patient/family/caregiver, referring to and communication with other health care professionals, documenting clinical information in the electronic or other health record and care coordination.     Liston Alba AGNP-C  [facility] - Radiation Oncology  [address]  [place], [place] [address]    Collaborating Physician: Elby Showers, MD    Parts of this note were created using voice recognition software.  Please excuse any grammatical or typographical errors.

## 2023-04-09 ENCOUNTER — Encounter: Admit: 2023-04-09 | Discharge: 2023-04-09 | Payer: BC Managed Care – PPO

## 2023-04-09 ENCOUNTER — Ambulatory Visit: Admit: 2023-04-09 | Discharge: 2023-04-09 | Payer: BC Managed Care – PPO

## 2023-04-09 DIAGNOSIS — C50919 Malignant neoplasm of unspecified site of unspecified female breast: Secondary | ICD-10-CM

## 2023-04-09 DIAGNOSIS — C50411 Malignant neoplasm of upper-outer quadrant of right female breast: Secondary | ICD-10-CM

## 2023-04-09 DIAGNOSIS — D0511 Intraductal carcinoma in situ of right breast: Secondary | ICD-10-CM

## 2023-04-09 DIAGNOSIS — Z9221 Personal history of antineoplastic chemotherapy: Secondary | ICD-10-CM

## 2023-04-09 DIAGNOSIS — J45909 Unspecified asthma, uncomplicated: Secondary | ICD-10-CM

## 2023-04-09 DIAGNOSIS — Z923 Personal history of irradiation: Secondary | ICD-10-CM

## 2023-04-09 DIAGNOSIS — K219 Gastro-esophageal reflux disease without esophagitis: Secondary | ICD-10-CM

## 2023-04-09 DIAGNOSIS — Z09 Encounter for follow-up examination after completed treatment for conditions other than malignant neoplasm: Secondary | ICD-10-CM

## 2023-07-03 ENCOUNTER — Encounter: Admit: 2023-07-03 | Discharge: 2023-07-03 | Payer: BC Managed Care – PPO

## 2023-07-03 ENCOUNTER — Ambulatory Visit: Admit: 2023-07-03 | Discharge: 2023-07-03 | Payer: BC Managed Care – PPO

## 2023-07-03 ENCOUNTER — Ambulatory Visit: Admit: 2023-07-03 | Discharge: 2023-07-04 | Payer: BC Managed Care – PPO

## 2023-07-03 DIAGNOSIS — K219 Gastro-esophageal reflux disease without esophagitis: Secondary | ICD-10-CM

## 2023-07-03 DIAGNOSIS — Z171 Estrogen receptor negative status [ER-]: Secondary | ICD-10-CM

## 2023-07-03 DIAGNOSIS — Z1231 Encounter for screening mammogram for malignant neoplasm of breast: Secondary | ICD-10-CM

## 2023-07-03 DIAGNOSIS — C50411 Malignant neoplasm of upper-outer quadrant of right female breast: Secondary | ICD-10-CM

## 2023-07-03 DIAGNOSIS — Z9189 Other specified personal risk factors, not elsewhere classified: Secondary | ICD-10-CM

## 2023-07-03 DIAGNOSIS — Z9889 Other specified postprocedural states: Secondary | ICD-10-CM

## 2023-07-03 DIAGNOSIS — D0511 Intraductal carcinoma in situ of right breast: Secondary | ICD-10-CM

## 2023-07-03 DIAGNOSIS — R921 Mammographic calcification found on diagnostic imaging of breast: Secondary | ICD-10-CM

## 2023-07-03 DIAGNOSIS — C50919 Malignant neoplasm of unspecified site of unspecified female breast: Secondary | ICD-10-CM

## 2023-07-03 DIAGNOSIS — Z923 Personal history of irradiation: Secondary | ICD-10-CM

## 2023-07-03 DIAGNOSIS — Z08 Encounter for follow-up examination after completed treatment for malignant neoplasm: Secondary | ICD-10-CM

## 2023-07-03 DIAGNOSIS — J45909 Unspecified asthma, uncomplicated: Secondary | ICD-10-CM

## 2023-07-03 DIAGNOSIS — Z9221 Personal history of antineoplastic chemotherapy: Secondary | ICD-10-CM

## 2023-07-03 NOTE — Progress Notes
Name: Tairra Bullis          MRN: [id number]      DOB: 07/09/1961      AGE: 62 y.o.   DATE OF SERVICE: 07/03/2023       Reason for Visit:  Cancer      Vianet Southworth is a 62 y.o. female.      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 Igo presents today for routine follow-up of right breast cancer. She is nearly 2 years out from diagnosis. She is doing well overall. She admits she is back to all of her regular activities and her hair has grown back curly, which she loves. She has no focal breast or systemic complaints. She continues on anastrozole, which she tolerates well. She completed anti-HER2 therapy in December 2023. She would like to discuss getting her port out. She otherwise denies any changes to her medical, surgical, or family history since her last visit.     HISTORY:  Ms. Aure is a female who presented to the [facility] on 08/19/2021 at age 28 for surgical consultation regarding her recently diagnosed right breast cancer. Ms. Lipford had no breast concerns such as palpable masses, skin changes, or nipple discharge prior to her screening mammogram which revealed a focal asymmetry with associated microcalcifications in the right breast.  Diagnostic mammogram showed a persistent asymmetry with associated fine pleomorphic calcifications in aggregate measuring approximately 2.3 cm.  There was no ultrasound correlate, therefore stereotactic biopsy was performed on 08/11/21 ([place]).  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 She has a history of a left breast biopsy in 2017 that showed a fibroadenoma.  She otherwise has no prior history of breast biopsies or surgeries. She underwent right RSL lumpectomy on 08/29/21.  Final pathology revealed 0.8 cm grade 2 hormone negative/HER2 positive IDC; 1.6 cm DCIS; LVI absent; margins clear (DCIS < 1 mm from medial margin).  Ms. Tate subsequently underwent right SLNB/port placement on 09/19/21.  Final pathology revealed 3 negative lymph nodes. She completed adjuvant Taxol/Herceptin on 09/06/21. She completed radiation therapy with Dr. Isabella Stalling on 03/02/22. She is now on Arimidex.     BREAST IMAGING:  Mammogram:    -- Bilateral screening mammogram 07/22/21 ([place]) revealed no suspicious abnormality is seen in the left breast. In the upper outer right breast, middle/posterior depth, there is a focal asymmetry with associated microcalcifications (MLO slice 30 and CC slice 38). Recommend right diagnostic mammogram and targeted ultrasound for further evaluation.   -- Right diagnostic mammogram 08/08/21 ([place]) revealed a persistent 1.5 cm focal asymmetry with subtle architectural distortion within the upper outer right breast middle-posterior depth at approximately 10:00. The focal asymmetry has associated fine pleomorphic calcifications in aggregate measuring approximately 2.0 x 1.5 x 2.3 cm in the AP, TV and CC dimensions.  Ultrasound:    -- Targeted right breast ultrasound 08/08/21 ([place]) revealed no convincing ultrasound correlate is identified for the mammographic focal asymmetry of concern. 5 morphologically normal appearing right axillary lymph nodes are documented. Impression: Suspicious focal asymmetry with associated calcifications in the upper outer right breast at 10:00. Stereotactic/tomosynthesis guided biopsy is recommended.      REPRODUCTIVE HEALTH:  Age at first Menarche: 63  Age at First Live Birth:  46  Age at Menopause:  1, no HRT  Gravida:  1  Para: 1  Breastfeeding:  None      PROCEDURE:   1.  Right RSL lumpectomy, 08/29/21 [facility])  2.  Right SLNB/port placement, 09/19/21 ([place])  PERTINENT PMH:  Asthma  FAMILY HISTORY:    Breast Cancer: Maternal Grandma dx age 20s  Ovarian Cancer: None   Prostate Cancer: Maternal Mercy Moore dx age 59    MEDICAL ONCOLOGY: Dr. Arliss Journey transferred to Dr. Windy Carina THERAPY: Taxol/Herceptin completed 09/06/21. PRESENT THERAPY: Arimidex.  REFERRED BY: Dr Steva Ready            No Known Allergies    Past Medical History:   Diagnosis Date    Asthma     Breast cancer (HCC)     Ductal carcinoma in situ (DCIS) of right breast 07/2021    GERD (gastroesophageal reflux disease)     Hx antineoplastic chemo     Personal history of irradiation      Surgical History:   Procedure Laterality Date    TUBAL LIGATION  1983    HX BREAST BIOPSY Left 06/07/2016    STereo bx benign    HX BREAST BIOPSY Right 08/11/2021    Stereo bx malignant    Right Radioactive Seed Localized Lumpectomy Right 08/29/2021    Performed by Ignatius Specking, MD at [facility]    RADIOLOGICAL EXAM SURGICAL SPECIMEN Right 08/29/2021    Performed by Ignatius Specking, MD at [facility]    ADJACENT TRANSFER/ REARRANGEMENT 10.1 SQ CM TO 30.0 SQ CM - TORSO Right 08/29/2021    Performed by Ignatius Specking, MD at [facility] OR    IDENTIFICATION SENTINEL LYMPH NODE Right 09/19/2021    Performed by Ignatius Specking, MD at [facility]    INJECTION RADIOACTIVE TRACER FOR SENTINEL NODE IDENTIFICATION Right 09/19/2021    Performed by Ignatius Specking, MD at [facility] OR    Right Sentinel Lymph Node Biopsy Right 09/19/2021    Performed by Ignatius Specking, MD at [facility] OR    INSERTION TUNNELED CENTRAL VENOUS ACCESS DEVICE WITH SUBCUTANEOUS PORT - AGE 73 YEARS AND OVER  09/19/2021    Performed by Dellia Cloud, DO at [facility] OR    FLUOROSCOPIC GUIDANCE CENTRAL VENOUS ACCESS DEVICE PLACEMENT/ REPLACEMENT/ REMOVAL  09/19/2021 Performed by Dellia Cloud, DO at [facility] OR    TONSILLECTOMY      at age 46      Family History   Problem Relation Name Age of Onset    Arthritis-osteo Mother      Thyroid Disease Father      Cancer-Breast Paternal Aunt  62    Cancer-Breast Maternal Grandmother          78s    Cancer-Prostate Maternal Grandfather      Diabetes Paternal Grandmother       Social History     Socioeconomic History    Marital status: Married   Tobacco Use    Smoking status: Never    Smokeless  tobacco: Never   Vaping Use    Vaping status: Never Used   Substance and Sexual Activity    Alcohol use: Yes     Alcohol/week: 1.0 standard drink of alcohol     Types: 1 Glasses of wine per week     Comment: monthly    Drug use: Not Currently       Vaping/E-liquid Use    Vaping Use Never User                   Objective:          acetaminophen (TYLENOL) 325 mg tablet Take two tablets by mouth every 6 hours as needed for Pain. Indications: take two tabs every 6 hours for the first three days after surgery, then take as needed    anastrozole (ARIMIDEX) 1 mg tablet Take one tablet by mouth daily.    citalopram (CELEXA) 20 mg tablet Take one tablet by mouth daily.    FLOVENT HFA 110 mcg/actuation inhaler     LORazepam (ATIVAN) 0.5 mg tablet Take one tablet by mouth every 8 hours as needed for Nausea, Vomiting or Other... (insomnia). Max Daily Amount: 1.5 mg.    omeprazole DR (PRILOSEC) 20 mg capsule      Vitals:    07/03/23 1025   BP: 128/75   BP Source: Arm, Left Upper   Pulse: 73   Temp: 37 ?C (98.6 ?F)   Resp: 16   SpO2: 98%   TempSrc: Oral   PainSc: Zero   Weight: 78.1 kg (172 lb 3.2 oz)     Body mass index is 31.5 kg/m?Marland Kitchen     Pain Score: Zero       Fatigue Scale: 0-None    Pain Addressed:  N/A    Patient Evaluated for a Clinical Trial: No treatment clinical trial available for this patient.     [place] Cooperative Oncology Group performance status is 0, Fully active, able to carry on all pre-disease performance without restriction.Marland Kitchen     Physical Exam  Vitals reviewed.   Chest:             RIGHT BREAST EXAM:  Breast:  S/p lumpectomy/SLNB with well-healed scars. No palpable masses. Mild radiation skin change. Mild central breast edema.   Skin Erythema:  No  Attachment of Overlying Skin:  No  Peau d' orange:  No  Chest Wall Attachment:  No  Nipple Inversion:  No  Nipple Discharge: No    LEFT BREAST EXAM:  Breast: No palpable breast masses. No skin, nipple, or areolar change.  Skin Erythema:  No  Attachment of Overlying Skin:  No  Peau d' orange:  No  Chest Wall Attachment: No  Nipple Inversion:  No  Nipple Discharge:  No    RIGHT NODAL BASIN EXAM:  Axillary:  negative  Infraclavicular:  negative  Supraclavicular:  negative    LEFT NODAL BASIN EXAM:  Axillary:  negative  Infraclavicular: negative  Supraclavicular:  negative    Constitutional: Alert, cooperative, NAD  Eyes: non-icteric, no discharge  ENT: NCAT, moist mucous membranes  Respiratory: non-labored respirations on RA, normal effort, no respiratory distress  Skin: warm, dry, no rashes or erythema, no cyanosis  Musculoskeletal: normal range of motion, no edema  Neuro: alert and orientated, moves all extremities, no cranial nerve deficit  Psych: behavior is normal, judgment and thought content normal  Lymphatic: no evidence of upper extremity lymphedema       IMAGING REVIEW:  EXAM:  MAMMO DIAGNOSTIC BIL/TOMO 07/03/23  9:32 AM     INDICATION:   62 year old female with history of right breast cancer status post breast   conservation therapy.  Patient follows up for diagnostic evaluation of   right breast calcifications near the lumpectomy site.     COMPARISON:   Compared to:   01/25/2018 MAMMO SCREEN BILAT/TOMO/CAD   07/22/2021 MAMMO SCREEN BILAT/TOMO   08/08/2021 MAMMO DIAGNOSTIC RT/TOMO   08/11/2021 MAMMO DIAGNOSTIC RT/TOMO   08/11/2021 STEREO BR BX/CLIP DEPL/SPEC/RT   08/26/2021 MAMMO GUIDE SEED LOC 1ST RT   06/20/2022 MAMMO DIAGNOSTIC BIL/TOMO   12/22/2022 MAMMO DIAG RT     BREAST COMPOSITION:   There are scattered areas of fibroglandular density.          TECHNIQUE:   2-D and 3-D (digital tomosynthesis) mammographic images were obtained of   the bilateral breast, including right breast spot magnification views.      FINDINGS:   There are post therapeutic findings within the right breast with evolving   areas of fat necrosis immediately adjacent to surgical clips within the   upper outer right breast.  There is no significant change in morphology or   distribution of grouped tiny round calcifications surrounding the   lumpectomy bed at 10 o'clock, favored for post therapeutic changes.     No suspicious abnormality within the left breast.     IMPRESSION:     Post therapeutic findings within the right breast with essentially stable   calcifications near the lumpectomy site in the upper outer right breast at   10:00.  Recommend short-term 72-month follow-up diagnostic mammogram.     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 Goudeau presents to clinic today with her husband for routine follow up. There is no clinical evidence of recurrence.  There have been no changes to her past medical or family history since her last visit.  She had imaging today with bilateral diagnostic mammogram, which showed post-therapeutic findings with stable calcifications at 10:00 at lumpectomy site. Results were reviewed during her visit today. Right diagnostic mammogram in 6 months is recommended and will be arranged. She continues to follow with Dr. Welton Flakes for medical oncology and remains on Anastrozole. She would like to discuss getting her port removed. She was encouraged to reach out to Dr. Welton Flakes to further discuss. She continues to follow with Dr. Isabella Stalling for radiation oncology.  Ms. Edmundson had bioimpedence spectroscopy (BIS) measurements today, and will continue to have this with each follow up visit in lieu of manual lymphedema measurements as long as her readings remain WNL. She denies any arm swelling or ROM issues. 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 We discussed treatment with compression and massage/MLD. We discussed that symptoms should improve with 4-6 weeks of interventions. She will meet with our lymphedema nurse today for further education and management. She was provided with a prescription for compressive  bras. She will return to clinic in 1 year with bilateral screening mammogram (pending right diagnostic mammogram results in 6 months). Ms. Uffelman and her husband were given ample time to ask questions, all of which were answered to their satisfaction. She was encouraged to call with any interval questions or concerns.     Continue follow up with Dr. Welton Flakes  Continue follow up with Dr. Jackolyn Confer visit today for right breast lymphedema  Right diagnostic mammogram in 6 months   BIS today and in 12 months  RTC in 12 months with bilateral screening mammogram (pending right diagnostic mammogram results in 6 months)    Renella Cunas, PA-C

## 2023-07-03 NOTE — Progress Notes
Lymphedema Prevention Bioimpedance Spectroscopy (BIS) Monitoring    Reviewed BIS testing results from today.    Results:     Current: -3.0  Baseline: 6.6  Change from Baseline: -9.6    WNL less than 3 standard deviation increase from baseline.      Notified patient via Clinic result was normal and to continue with routine follow up as scheduled. Provided clinic contact information for any questions or concerns.

## 2023-07-03 NOTE — Progress Notes
Saw Kristy Martinez as an in clinic add-on for breast lymphedema. She reported aching and heaviness in her right breast. She stated signs and symptoms started after the completion of radiation. She wears a compression bra during the day on a routine basis.     Relevant Treatment History:  Surgery: right RSL lumpectomy on 08/29/21, right SLNB (0/3)/port placement on 09/19/21    Chemotherapy: adjuvant Taxol/Herceptin completed 01/11/2022, one year of Herceptin completed 10/27/2022   Radiation therapy: completed on 03/02/22     Assessment: right breast only  Skin intact, no rash or ulcerations noted.  Axilla and breast with well healed scars.  Mild radiation skin changes noted.  Erythema: none noted  Edema: noted in central and dependent breast  Peau d'orange: none noted  Ridging: none noted  Skin thickening: minimal, noted in lower inner quadrant   ROM: WNL, able to raise right arm above head without hesitation or guarding     Lymphedema Stage:  Mild Right Breast Lymphedema     Education:  Reviewed assessment with patient. Usual post surgical and radiation changes discussed. General overview of breast lymphedema pathophysiology, signs and symptoms, potential aggravating factors, early treatment interventions, and risk of not treating explained. Discussed and demonstrated right side crawl the wall stretch and modified manual lymphatic drainage self-massage using stretch and pull (stationary circles) technique. Instructed patient to direct fluid towards parasternal and contralateral axillary lymph nodes. Discussed importance and qualities of a proper fitting compression bra. Informed bras that lack these qualities and/or have under-wires can obstruct and impair transport of fluid. Discussed use of rolled up sock or washcloth for added direct pressure against area of thickened skin in inner lower right breast, rationale provided. Explained it can take several weeks of daily compliance with interventions for breast lymphedema to resolve. Reviewed how to back out of breast lymphedema interventions once fluid congestion has completely cleared. Compression bra prescription, list of area vendors, and printed handouts of covered materials provided at time of today's visit. Verified patient was aware of worsening symptoms as well as when to notify the clinic.      Recommendations:   Wear proper fitting compression bra 24 hours per day, unless showering or performing self-massage. Right side crawl the wall stretch and modified manual lymphatic drainage self-massage twice per day, morning and night. Rolled up wash cloth or sock inside bra against inner lower quadrant of right breast 2 hours every evening.     Plan:  Follow-up as needed. Patient to notify clinic if signs and symptoms worsen or persist without improvement after 4 weeks of consistent interventions. Patient was agreeable with recommendations and follow-up plan. All questions answered. Contact information provided should questions or concerns arise.       Marland Kitchen

## 2023-07-30 ENCOUNTER — Encounter: Admit: 2023-07-30 | Discharge: 2023-07-30 | Payer: BC Managed Care – PPO

## 2023-07-30 DIAGNOSIS — E041 Nontoxic single thyroid nodule: Secondary | ICD-10-CM

## 2023-07-30 DIAGNOSIS — C50411 Malignant neoplasm of upper-outer quadrant of right female breast: Secondary | ICD-10-CM

## 2023-07-30 DIAGNOSIS — C50919 Malignant neoplasm of unspecified site of unspecified female breast: Secondary | ICD-10-CM

## 2023-07-30 DIAGNOSIS — D0511 Intraductal carcinoma in situ of right breast: Secondary | ICD-10-CM

## 2023-07-30 DIAGNOSIS — Z9221 Personal history of antineoplastic chemotherapy: Secondary | ICD-10-CM

## 2023-07-30 DIAGNOSIS — Z79811 Long term (current) use of aromatase inhibitors: Secondary | ICD-10-CM

## 2023-07-30 DIAGNOSIS — K219 Gastro-esophageal reflux disease without esophagitis: Secondary | ICD-10-CM

## 2023-07-30 DIAGNOSIS — J45909 Unspecified asthma, uncomplicated: Secondary | ICD-10-CM

## 2023-07-30 DIAGNOSIS — Z923 Personal history of irradiation: Secondary | ICD-10-CM

## 2023-07-31 ENCOUNTER — Encounter: Admit: 2023-07-31 | Discharge: 2023-07-31 | Payer: BC Managed Care – PPO

## 2023-07-31 DIAGNOSIS — Z9221 Personal history of antineoplastic chemotherapy: Secondary | ICD-10-CM

## 2023-07-31 DIAGNOSIS — D0511 Intraductal carcinoma in situ of right breast: Secondary | ICD-10-CM

## 2023-07-31 DIAGNOSIS — K219 Gastro-esophageal reflux disease without esophagitis: Secondary | ICD-10-CM

## 2023-07-31 DIAGNOSIS — J45909 Unspecified asthma, uncomplicated: Secondary | ICD-10-CM

## 2023-07-31 DIAGNOSIS — Z923 Personal history of irradiation: Secondary | ICD-10-CM

## 2023-07-31 DIAGNOSIS — C50919 Malignant neoplasm of unspecified site of unspecified female breast: Secondary | ICD-10-CM

## 2023-08-02 ENCOUNTER — Encounter: Admit: 2023-08-02 | Discharge: 2023-08-02 | Payer: BC Managed Care – PPO

## 2023-08-02 ENCOUNTER — Ambulatory Visit: Admit: 2023-08-02 | Discharge: 2023-08-02 | Payer: BC Managed Care – PPO

## 2023-08-02 DIAGNOSIS — C50411 Malignant neoplasm of upper-outer quadrant of right female breast: Secondary | ICD-10-CM

## 2023-08-02 MED ORDER — CEFAZOLIN 1 GRAM IJ SOLR
2 g | Freq: Once | INTRAVENOUS | 0 refills
Start: 2023-08-02 — End: ?

## 2023-08-02 NOTE — Telephone Encounter
Pt scheduled for Port Removal with Dr. Kennedy Bucker on 08/31/23 at [facility].    Instructions given, pt states understanding.

## 2023-08-13 ENCOUNTER — Encounter: Admit: 2023-08-13 | Discharge: 2023-08-13 | Payer: BC Managed Care – PPO

## 2023-08-21 ENCOUNTER — Encounter: Admit: 2023-08-21 | Discharge: 2023-08-21 | Payer: BC Managed Care – PPO

## 2023-08-21 DIAGNOSIS — J45909 Unspecified asthma, uncomplicated: Secondary | ICD-10-CM

## 2023-08-21 DIAGNOSIS — C50919 Malignant neoplasm of unspecified site of unspecified female breast: Secondary | ICD-10-CM

## 2023-08-21 DIAGNOSIS — D0511 Intraductal carcinoma in situ of right breast: Secondary | ICD-10-CM

## 2023-08-21 DIAGNOSIS — Z923 Personal history of irradiation: Secondary | ICD-10-CM

## 2023-08-21 DIAGNOSIS — K219 Gastro-esophageal reflux disease without esophagitis: Secondary | ICD-10-CM

## 2023-08-21 DIAGNOSIS — Z9221 Personal history of antineoplastic chemotherapy: Secondary | ICD-10-CM

## 2023-08-28 ENCOUNTER — Encounter: Admit: 2023-08-28 | Discharge: 2023-08-28 | Payer: BC Managed Care – PPO

## 2023-08-30 ENCOUNTER — Encounter: Admit: 2023-08-30 | Discharge: 2023-08-30 | Payer: BC Managed Care – PPO

## 2023-08-31 ENCOUNTER — Encounter: Admit: 2023-08-31 | Discharge: 2023-08-31 | Payer: BC Managed Care – PPO

## 2023-08-31 ENCOUNTER — Ambulatory Visit: Admit: 2023-08-31 | Discharge: 2023-08-31 | Payer: BC Managed Care – PPO

## 2023-08-31 DIAGNOSIS — J45909 Unspecified asthma, uncomplicated: Secondary | ICD-10-CM

## 2023-08-31 DIAGNOSIS — Z9221 Personal history of antineoplastic chemotherapy: Secondary | ICD-10-CM

## 2023-08-31 DIAGNOSIS — C50919 Malignant neoplasm of unspecified site of unspecified female breast: Secondary | ICD-10-CM

## 2023-08-31 DIAGNOSIS — D0511 Intraductal carcinoma in situ of right breast: Secondary | ICD-10-CM

## 2023-08-31 DIAGNOSIS — Z923 Personal history of irradiation: Secondary | ICD-10-CM

## 2023-08-31 DIAGNOSIS — K219 Gastro-esophageal reflux disease without esophagitis: Secondary | ICD-10-CM

## 2023-08-31 MED FILL — OXYCODONE 5 MG PO TAB: 5 mg | ORAL | 2 days supply | Qty: 5 | Fill #1 | Status: AC

## 2023-09-01 ENCOUNTER — Encounter: Admit: 2023-09-01 | Discharge: 2023-09-01 | Payer: BC Managed Care – PPO

## 2023-09-01 DIAGNOSIS — C50919 Malignant neoplasm of unspecified site of unspecified female breast: Secondary | ICD-10-CM

## 2023-09-01 DIAGNOSIS — Z923 Personal history of irradiation: Secondary | ICD-10-CM

## 2023-09-01 DIAGNOSIS — K219 Gastro-esophageal reflux disease without esophagitis: Secondary | ICD-10-CM

## 2023-09-01 DIAGNOSIS — D0511 Intraductal carcinoma in situ of right breast: Secondary | ICD-10-CM

## 2023-09-01 DIAGNOSIS — Z9221 Personal history of antineoplastic chemotherapy: Secondary | ICD-10-CM

## 2023-09-01 DIAGNOSIS — J45909 Unspecified asthma, uncomplicated: Secondary | ICD-10-CM

## 2023-09-03 ENCOUNTER — Encounter: Admit: 2023-09-03 | Discharge: 2023-09-03 | Payer: BC Managed Care – PPO

## 2023-09-11 ENCOUNTER — Encounter: Admit: 2023-09-11 | Discharge: 2023-09-11 | Payer: BC Managed Care – PPO

## 2023-10-12 ENCOUNTER — Encounter: Admit: 2023-10-12 | Discharge: 2023-10-12 | Payer: BC Managed Care – PPO

## 2023-10-12 MED ORDER — ANASTROZOLE 1 MG PO TAB
1 mg | ORAL_TABLET | Freq: Every day | ORAL | 3 refills | 33.00000 days | Status: AC
Start: 2023-10-12 — End: ?

## 2024-01-03 ENCOUNTER — Encounter: Admit: 2024-01-03 | Discharge: 2024-01-03 | Payer: BC Managed Care – PPO

## 2024-01-03 ENCOUNTER — Ambulatory Visit: Admit: 2024-01-03 | Discharge: 2024-01-04 | Payer: BC Managed Care – PPO

## 2024-01-03 DIAGNOSIS — C50411 Malignant neoplasm of upper-outer quadrant of right female breast: Secondary | ICD-10-CM

## 2024-01-03 DIAGNOSIS — R921 Mammographic calcification found on diagnostic imaging of breast: Secondary | ICD-10-CM

## 2024-02-01 ENCOUNTER — Encounter: Admit: 2024-02-01 | Discharge: 2024-02-01 | Payer: BC Managed Care – PPO

## 2024-04-01 ENCOUNTER — Encounter: Admit: 2024-04-01 | Discharge: 2024-04-01 | Payer: BLUE CROSS/BLUE SHIELD

## 2024-04-05 NOTE — Progress Notes
 Radiation Oncology Follow Up Note  Date: 04/07/2024       Kristy Martinez is a 63 y.o. female.     The primary encounter diagnosis was Malignant neoplasm of upper-outer quadrant of right breast in female, estrogen receptor positive (CMS-HCC). A diagnosis of Radiotherapy follow-up was also pertinent to this visit.  Staging:  Cancer Staging   Malignant neoplasm of upper-outer quadrant of right breast in female, estrogen receptor negative (CMS-HCC)  Staging form: Breast, AJCC 8th Edition  - Clinical stage from 08/17/2021: Stage 0 (cTis (DCIS), cN0, cM0, ER+, PR+, HER2: Not Assessed) - Signed by Lem Pyle, PA-C on 08/17/2021  - Pathologic stage from 09/05/2021: Stage IA (pT1b, pN0, cM0, G2, ER-, PR-, HER2+) - Signed by Lem Pyle, PA-C on 09/21/2021      History of Present Illness  Ms. Kristy Brisk. Martinez is a 63 y.o. female with oncologic history as follows:     - 07/22/2021 ([facility]) bilateral screening mammogram: No suspicious abnormality in the left breast.  In the upper outer right breast, middle posterior depth there is a focal asymmetry with associated microcalcifications.  - 08/08/2021 ([facility]) right diagnostic mammogram: Persistent 1.5 cm focal asymmetry with subtle architectural distortion in the upper outer right breast middle posterior depth at approximately 10:00, with associated fine pleomorphic calcifications.  The calcifications in aggregate measure approximately 2.0 x 1.5 x 2.3 cm.  - 08/08/2021 ([facility]) targeted right breast ultrasound: No convincing ultrasound correlate identified for mammographic asymmetry of concern.  5 morphologically normal-appearing right axillary lymph nodes.  Suspicious focal asymmetry with associated calcifications upper outer right breast 10:00, biopsy recommended.  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  -08/29/2021 right radioactive seed localized lumpectomy.  The final pathology showing a small focus of invasive ductal carcinoma, grade 2 with extensive intraductal component, high nuclear grade.  ER less than 1%, PR 0, HER2 3+.  -On 09/19/2021 the patient underwent right axillary sentinel node biopsy.  Final pathology confirmed 0 of 3 lymph nodes to be involved with metastatic carcinoma.     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 She tolerated treatment well and had no treatment interruptions.       03/02/2022   Course ID [id number] [id number] [id number]   First Treatment Date 02-09-2022 02:10PM   Last Treatment Date 03-02-2022 03:24PM   Treatment Elapsed Days 21   Reference Point ID RtBreast   Dosage Given To Date 42.56   Session Dosage Given 2.66   Plan ID [id number]   Fractions Treated to Date 16   Total Fractions on Plan 16   Prescribed Dose per Fraction 2.66   Prescription Dose 4,256        Subjective:       Kristy Martinez returns today for routine follow-up. She is accompanied by her husband to today's visit. Doing well. No breast complaints. No tightness of her chest wall or shoulder. Patient denies shortness of air, cough or chills. Patient denies neurologic symptoms such as headache, dizziness or vision changes. Energy levels are excellent. She stays very active in retirement - enjoys traveling and working on various projects. She has lost 20 pounds intentionally with semaglutide, diet changes and exercise. She has found a fitness class that  she really enjoys. Unfortunately, her mother passed away last fall after having a stroke. She does help care for her father now, though he is fortunately in excellent health.          Review of Systems   Constitutional:  Negative for fatigue and fever.   HENT:  Negative for sore throat and trouble swallowing.    Respiratory:  Negative for cough and shortness of breath.    Musculoskeletal:  Negative for arthralgias and myalgias.   Skin:  Negative for color change.   Neurological:  Negative for light-headedness and headaches.   Hematological:  Negative for adenopathy.   Psychiatric/Behavioral:  The patient is not nervous/anxious.    All other systems reviewed and are negative.      Objective:          acetaminophen (TYLENOL) 325 mg tablet Take two tablets by mouth every 6 hours as needed for Pain. Indications: take two tabs every 6 hours for the first three days after surgery, then take as needed    albuterol sulfate (PROAIR HFA) 90 mcg/actuation HFA aerosol inhaler INHALE TWO PUFFS BY MOUTH EVERY 4 TO 6 HOURS AS NEEDED SHORTNESS OF BREATH or FOR WHEEZING    anastrozole (ARIMIDEX) 1 mg tablet TAKE ONE TABLET BY MOUTH EVERY DAY    ARNUITY ELLIPTA 200 mcg/actuation disk inhaler     CHOLEcalciferoL (vitamin D3) 1,000 units tablet Take one tablet by mouth daily.    citalopram (CELEXA) 20 mg tablet Take one tablet by mouth daily.    FLOVENT HFA 110 mcg/actuation inhaler     LORazepam (ATIVAN) 0.5 mg tablet Take one tablet by mouth every 8 hours as needed for Nausea, Vomiting or Other... (insomnia). Max Daily Amount: 1.5 mg.    omeprazole DR (PRILOSEC) 20 mg capsule  (Patient not taking: Reported on 02/01/2024)    oxyCODONE (ROXICODONE) 5 mg tablet Take one tablet by mouth every 6 hours as needed for Pain.    pantoprazole DR (PROTONIX) 40 mg tablet Take one tablet by mouth daily.    predniSONE (DELTASONE) 20 mg tablet TAKE ONE TABLET BY MOUTH TWICE DAILY FOR 3 DAYS, then TAKE ONE TABLET DAILY FOR 3 DAYS, then TAKE ONE-HALF TABLET DAILY FOR 3 DAYS    simvastatin (ZOCOR) 20 mg tablet Take one tablet by mouth at bedtime daily.    SPIRIVA RESPIMAT 2.5 mcg/actuation inhaler INHALE 2 PUFFS BY MOUTH EVERY DAY     Vitals:    04/07/24 1045   BP: 136/83   Pulse: 77   Temp: 37 ?C (98.6 ?F)   Resp: 18   SpO2: 98%   TempSrc: Temporal   PainSc: Zero   Weight: 68.9 kg (152 lb)     Body mass index is 27.8 kg/m?Kristy Martinez     Pain Score: Zero Fatigue Scale: 0-None    KARNOFSKY PERFORMANCE SCORE:  100% Normal, no complaints     Physical Exam  Vitals reviewed.   Constitutional:       General: She is not in acute distress.     Appearance: Normal appearance. She is well-developed. She is not ill-appearing.   HENT:      Head: Normocephalic and atraumatic.   Eyes:      General: Lids are normal.      Extraocular Movements: Extraocular movements intact.      Conjunctiva/sclera: Conjunctivae normal.   Cardiovascular:      Rate and Rhythm: Normal rate and regular rhythm.      Heart sounds: Normal heart sounds.  Pulmonary:      Effort: Pulmonary effort is normal.      Breath sounds: Normal breath sounds.   Chest:      Comments: No palpable abnormalities in bilateral breasts.  No skin, nipple, or areolar changes bilaterally.      Musculoskeletal:      Cervical back: Neck supple.   Lymphadenopathy:      Cervical: No cervical adenopathy.      Upper Body:      Right upper body: No supraclavicular or axillary adenopathy.      Left upper body: No supraclavicular or axillary adenopathy.   Skin:     General: Skin is warm and dry.      Coloration: Skin is not pale.   Neurological:      Mental Status: She is alert and oriented to person, place, and time. Mental status is at baseline.      Cranial Nerves: Cranial nerves 2-12 are intact.      Sensory: Sensation is intact.      Motor: Motor function is intact.   Psychiatric:         Attention and Perception: Attention normal.         Mood and Affect: Mood and affect normal.         Speech: Speech normal.         Behavior: Behavior normal.         Cognition and Memory: Cognition and memory normal.            Laboratory:    Comprehensive Metabolic Profile    Lab Results   Component Value Date/Time    NA 140 01/11/2022 01:31 PM    K 3.6 01/11/2022 01:31 PM    CL 104 01/11/2022 01:31 PM    CO2 31 (H) 01/11/2022 01:31 PM    GAP 5 01/11/2022 01:31 PM    BUN 13 01/11/2022 01:31 PM    CR 0.8 05/24/2022 08:06 AM    GLU 136 (H) 01/11/2022 01:31 PM    Lab Results   Component Value Date/Time    CA 8.6 01/11/2022 01:31 PM    ALBUMIN 3.8 01/11/2022 01:31 PM    TOTPROT 6.3 01/11/2022 01:31 PM    ALKPHOS 64 01/11/2022 01:31 PM    AST 16 01/11/2022 01:31 PM    ALT 18 01/11/2022 01:31 PM    TOTBILI 0.3 01/11/2022 01:31 PM        CBC w diff    Lab Results   Component Value Date/Time    WBC 5.7 01/11/2022 01:31 PM    RBC 3.61 (L) 01/11/2022 01:31 PM    HGB 11.3 (L) 01/11/2022 01:31 PM    HCT 33.6 (L) 01/11/2022 01:31 PM    MCV 93.1 01/11/2022 01:31 PM    MCH 31.4 01/11/2022 01:31 PM    MCHC 33.7 01/11/2022 01:31 PM    RDW 16.9 (H) 01/11/2022 01:31 PM    PLTCT 360 01/11/2022 01:31 PM    MPV 7.3 01/11/2022 01:31 PM    Lab Results   Component Value Date/Time    NEUT 66 01/11/2022 01:31 PM    ANC 3.80 01/11/2022 01:31 PM    LYMA 23 (L) 01/11/2022 01:31 PM    ALC 1.30 01/11/2022 01:31 PM    MONA 6 01/11/2022 01:31 PM    AMC 0.30 01/11/2022 01:31 PM    EOSA 4 01/11/2022 01:31 PM    AEC 0.20 01/11/2022 01:31 PM    BASA 1 01/11/2022 01:31 PM  ABC 0.10 01/11/2022 01:31 PM          Imaging:   BONE DENSITY SPINE/HIP  Narrative: BONE DENSITOMETRY     CLINICAL INDICATION: 63 year old female. Breast cancer. Aromatase inhibitor use.    COMPARISON: 01/26/2023    FINDINGS: DEXA scan of the lumbar spine and bilateral hips was performed. FRAX score, if applicable, was calculated from patient reported risk factors and femoral neck bone density.    LUMBAR SPINE, L1-L4  Current: 1.173 g/cm2, T-score of -0.2       Previous: 1.096 g/cm2, T-score of -0.8    LEFT FEMORAL NECK    Current: 0.816 g/cm2, T-score of -1.6       Previous: 0.790 g/cm2, T-score of -1.8    LEFT TOTAL HIP    Current: 0.828 g/cm2, T-score of -1.4   Previous: 0.837 g/cm2, T-score of -1.4    RIGHT FEMORAL NECK  Current: 0.722 g/cm2, T-score of -2.3       Previous: 0.755 g/cm2, T-score of -2.0    RIGHT TOTAL HIP  Current: 0.799 g/cm2, T-score of -1.7   Previous: 0.825 g/cm2, T-score of -1.5      FRAX SCORE 10 year risk hip fracture = 2.0%   10 year risk major osteoporotic fracture = 11.4%     WHO Criteria for Diagnosis of Osteoporosis (T-score)*  Normal (-1.0 and above)  Low bone mass, referred to as osteopenia (Between -1.0 and -2.5)  Osteoporosis (-2.5 and below)    *Based on region with lowest bone mineral density.  Impression: 1.  Persistent low bone mass (osteopenia).  2.  Interval increase in bone mineral density in the lumbar spine since 01/26/2023    General comments regarding interpretation of bone mineral density measurements:    a) Consider FDA-approved medical therapies in the setting of 1) Hip or vertebral fracture, 2) Osteoporosis, and 3) low bone mass, referred to as osteopenia, with FRAX score of greater than or equal to 3% for hip fracture or greater than or equal to 20% for major osteoporotic fracture. Treatment may also be indicated based on clinical judgement and/or patient preference.     b) Interval between BMD testing should be determined according to each patient's clinical status: typically one year after initiation or change of therapy is appropriate, with longer intervals once therapeutic effect is established. In conditions with rapid bone loss, such as glucocorticoid therapy, testing more frequently is appropriate.     By my electronic signature, I attest that I have personally reviewed the images for this examination and formulated the interpretations and opinions expressed in this report     Finalized by Kristy Martinez, M.D. on 02/01/2024 11:11 AM. Dictated by Kristy Martinez, D.O. on 02/01/2024 10:28 AM.       Path:  PATHOLOGY REPORT   Date Value Ref Range Status   09/19/2021   Final    [facility]  [email or web address]    Department of Pathology and Laboratory Medicine  [address], [place]  [place], [place] [address]  Surgical Pathology Office:  [phone]  Fax:  [phone]  SURGICAL PATHOLOGY REPORT    NAME: DICIE, EDELEN SURG PATH #: [id number] MR #: [id number] SPECIMEN  CLASS: SI BILLING #: [id number] ALT ID #:  LOCATION: [id number] DATE OF  PROCEDURE: 09/19/2021 AGE:  60 SEX: F DATE RECEIVED: 09/19/2021 DOB:  12-Nov-1961  TIME RECEIVED:  11:02 PHYSICIAN: Jorie Newness, MD DATE OF  REPORT: 09/21/2021 COPY TO:  DATE OF PRINTING: 09/21/2021         ########################################################################  Final Diagnosis:    A. Lymph nodes, right axillary sentinel lymph nodes, biopsy:    Three lymph nodes, negative for metastatic carcinoma (0/3).  Deeper sections and pancytokeratin staining support the absence of  carcinoma.       Attestation:  By this signature, I attest that I have personally formulated the final  interpretation expressed in this report and that the above diagnosis is  based upon my examination of the slides and/or other material indicated in  this report.    +++Electronically Signed Out By Melanee Spire, MD on 09/21/2021+++             bm/09/19/2021             ########################################################################  Material Received:  A: right axillary sentinel lymph nodes    History:  63 year old female with a history of ductal carcinoma in situ of right  breast.         Gross Description:  A. Received fresh, labeled with the patient's name and right axillary  sentinel lymph nodes, are three tan-yellow lymph nodes measuring 0.7-1.2  cm in greatest dimension. The specimen is entirely submitted as follows:  A1      2 possible lymph nodes, bisected (inked black and blue).   A2      1 possible lymph node, serially sectioned.   A3      Remainder of fibroadipose tissue with possible minute lymph nodes.  (ket)    kp/09/19/2021           If immunohistochemical stains and/or in situ hybridization are cited in  this report, the performance characteristics were determined by the  Department of Pathology and Laboratory Medicine of the [facility]) in compliance with CLIA'88  regulations. Some of these tests rely on the use of analyte specific  reagents and are subject to specific labeling requirements by the FDA.  The stains are performed on formalin-fixed, paraffin-embedded tissue,  unless otherwise stated. Known positive and negative control tissues  demonstrate appropriate staining. Results should be interpreted with  caution given the likelihood of false negativity on decalcified specimens.  This testing was developed by the Department of Pathology and Laboratory  Medicine of the [facility] [place] . It has not been cleared or approved  by the FDA.  The FDA has determined that such clearance or approval is not  necessary.    Professional services performed by [facility], [place], at [address], [place], [place]  [address]     ]     Assessment and Plan:   Ms. Kamiyah Kindel is a 63 y.o. 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  She status post breast conserving surgery without axillary lymph node sampling on 08/29/2021.  She was found to have invasive ductal carcinoma ER less than 1% PR 0%, HER2 3+.  On 09/19/2021 she had right axillary sentinel lymph node biopsy which confirmed a 0 of 3 sentinel nodes containing metastatic disease.  She received chemotherapy and completed 1 year of Herceptin. She underwent adjuvant radiotherapy to her right breast to a dose of 4256 cGy in 16 fractions from 02/09/2022 - 03/02/2022.     - Nowell Begun was seen today for routine follow-up after completion of radiation therapy.  At this time, she is clinically stable with no clinical evidence of disease recurrence or progression.  She  has no subacute toxicities from having undergone adjuvant radiation.  - bilateral mammogram 06/2023 with no suspicious abnormalities within the left and calcifications in the right breast near lumpectomy cavity. 6 month follow-up right mammogram showed likely benign calcifications and benign fat necrosis. Bilateral mammogram scheduled in August.  - Our plan will be for the patient to return to our clinic in 1 year for continued follow-up.  She may contact us  in the interim if there are any questions or concerns requiring earlier assessment.  -The patient will keep their appointments with their other managing providers including medical oncology, Dr.Khan and with breast surgery. She remains on anastrozole.           Total time for today's visit was 25 minutes. Visit time spent on the following: preparing to see the patient, obtaining and/or reviewing separately obtained history/information, performing a physical examination and/or evaluation, counseling and educating the patient/family/caregiver, referring to and communication with other health care professionals, documenting clinical information in the electronic or other health record and care coordination.     Quilla Brunswick AGNP-C  [facility] - Radiation Oncology  [address]  [place], [place] [address]    Collaborating Physician: Daivd Dub, MD    Parts of this note were created using voice recognition software.  Please excuse any grammatical or typographical errors.

## 2024-04-07 ENCOUNTER — Encounter: Admit: 2024-04-07 | Discharge: 2024-04-07 | Payer: BLUE CROSS/BLUE SHIELD

## 2024-04-07 ENCOUNTER — Ambulatory Visit: Admit: 2024-04-07 | Discharge: 2024-04-07 | Payer: BLUE CROSS/BLUE SHIELD

## 2024-04-07 DIAGNOSIS — C50411 Malignant neoplasm of upper-outer quadrant of right female breast: Secondary | ICD-10-CM

## 2024-04-07 DIAGNOSIS — Z09 Encounter for follow-up examination after completed treatment for conditions other than malignant neoplasm: Secondary | ICD-10-CM

## 2024-05-06 ENCOUNTER — Encounter: Admit: 2024-05-06 | Discharge: 2024-05-06 | Payer: BLUE CROSS/BLUE SHIELD

## 2024-07-04 ENCOUNTER — Encounter: Admit: 2024-07-04 | Discharge: 2024-07-04 | Payer: BLUE CROSS/BLUE SHIELD

## 2024-08-12 ENCOUNTER — Encounter: Admit: 2024-08-12 | Discharge: 2024-08-12 | Payer: MEDICARE

## 2024-08-18 ENCOUNTER — Encounter: Admit: 2024-08-18 | Discharge: 2024-08-18 | Payer: MEDICARE

## 2024-08-18 NOTE — Progress Notes
 Name: Kristy Martinez          MRN: [id number]      DOB: Mar 22, 1961      AGE: 63 y.o.   DATE OF SERVICE: 08/19/2024       Reason for Visit:  Follow Up and Heme/Onc Care      Kristy Martinez is a 63 y.o. female.      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 She is 3 years out from diagnosis. She is doing well overall. She admits she lost weight with GLP-1 and then stopped the medication and gained 10 lbs. She noticed her breast lymphedema improved, but then worsened a bit after putting on 10 lbs. She has been doing massage. She admits it is a little tender, but not as symptomatic as it was at the time of her visit last year. She otherwise has no focal breast or systemic complaints. She continues on anastrozole . She admits that she just was started on Fosamax today for her bones. She denies any other changes to her medical, surgical, or family history since her last visit.     HISTORY:  Kristy Martinez is a female who presented to the [facility] on 08/19/2021 at age 45 for surgical consultation regarding her recently diagnosed right breast cancer. Kristy Martinez had no breast concerns such as palpable masses, skin changes, or nipple discharge prior to her screening mammogram which revealed a focal asymmetry with associated microcalcifications in the right breast.  Diagnostic mammogram showed a persistent asymmetry with associated fine pleomorphic calcifications in aggregate measuring approximately 2.3 cm.  There was no ultrasound correlate, therefore stereotactic biopsy was performed on 08/11/21 ([place]).  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 She has a history of a left breast biopsy in 2017 that showed a fibroadenoma.  She otherwise has no prior history of breast biopsies or surgeries. She underwent right RSL lumpectomy on 08/29/21.  Final pathology revealed 0.8 cm grade 2 hormone negative/HER2 positive IDC; 1.6 cm DCIS; LVI absent; margins clear (DCIS < 1 mm from medial margin).  Kristy Martinez subsequently underwent right SLNB/port placement on 09/19/21.  Final pathology revealed 3 negative lymph nodes. She completed adjuvant Taxol /Herceptin  on 09/06/21. She completed radiation therapy with Kristy Martinez on 03/02/22. She is now on Arimidex .     BREAST IMAGING:  Mammogram:    -- Bilateral screening mammogram 07/22/21 ([place]) revealed no suspicious abnormality is seen in the left breast. In the upper outer right breast, middle/posterior depth, there is a focal asymmetry with associated microcalcifications (MLO slice 30 and CC slice 38). Recommend right diagnostic mammogram and targeted ultrasound for further evaluation.   -- Right diagnostic mammogram 08/08/21 ([place]) revealed a persistent 1.5 cm focal asymmetry with subtle architectural distortion within the upper  outer right breast middle-posterior depth at approximately 10:00. The focal asymmetry has associated fine pleomorphic calcifications in aggregate measuring approximately 2.0 x 1.5 x 2.3 cm in the AP, TV and CC dimensions.   Ultrasound:    -- Targeted right breast ultrasound 08/08/21 ([place]) revealed no convincing ultrasound correlate is identified for the mammographic focal asymmetry of concern. 5 morphologically normal appearing right axillary lymph nodes are documented. Impression: Suspicious focal asymmetry with associated calcifications in the upper outer right breast at 10:00. Stereotactic/tomosynthesis guided biopsy is recommended.      REPRODUCTIVE HEALTH:  Age at first Menarche:  43  Age at First Live Birth:  87  Age at Menopause:  47, no HRT  Gravida:  1  Para: 1  Breastfeeding:  None      PROCEDURE:   1.  Right RSL lumpectomy, 08/29/21 [place])  2.  Right SLNB/port placement, 09/19/21 ([place])  PERTINENT PMH:  Asthma  FAMILY HISTORY:    Breast Cancer: Maternal Grandma dx age 40s  Ovarian Cancer: None   Prostate Cancer: Maternal Kristy Martinez dx age 33    MEDICAL ONCOLOGY: Kristy Martinez transferred to [facility] THERAPY: Taxol /Herceptin  completed 09/06/21. PRESENT THERAPY: Arimidex .  REFERRED BY: Kristy Martinez          Allergies[1]    Past Medical History:    Anxiety    Asthma    Breast cancer (CMS-HCC)    Ductal carcinoma in situ (DCIS) of right breast    GERD (gastroesophageal reflux disease)    Hx antineoplastic chemo    Personal history of irradiation     Surgical History:   Procedure Laterality Date    TUBAL LIGATION  1983    HX BREAST BIOPSY Left 06/07/2016    STereo bx benign    HX BREAST BIOPSY Right 08/11/2021    Stereo bx malignant    Right Radioactive Seed Localized Lumpectomy Right 08/29/2021    Performed by Kristy Larraine BRAVO, MD at [facility] OR    RADIOLOGICAL EXAM SURGICAL SPECIMEN Right 08/29/2021    Performed by Kristy Larraine BRAVO, MD at [facility] OR    ADJACENT TRANSFER/ REARRANGEMENT 10.1 SQ CM TO 30.0 SQ CM - TORSO Right 08/29/2021    Performed by Kristy Larraine BRAVO, MD at [facility] OR    IDENTIFICATION SENTINEL LYMPH NODE Right 09/19/2021    Performed by Kristy Larraine BRAVO, MD at [facility] OR    INJECTION RADIOACTIVE TRACER FOR SENTINEL NODE IDENTIFICATION Right 09/19/2021    Performed by Kristy Larraine BRAVO, MD at [facility] OR    Right Sentinel Lymph Node Biopsy Right 09/19/2021    Performed by Kristy Larraine BRAVO, MD at [facility] OR    INSERTION TUNNELED CENTRAL VENOUS ACCESS DEVICE WITH SUBCUTANEOUS PORT - AGE 15 YEARS AND OVER  09/19/2021 Performed by Alice Laymon CROME, DO at [facility] OR    FLUOROSCOPIC GUIDANCE CENTRAL VENOUS ACCESS DEVICE PLACEMENT/ REPLACEMENT/ REMOVAL  09/19/2021    Performed by Alice Laymon CROME, DO at [facility] OR    Port-a-cath removal N/A 08/31/2023    Performed by Alice Laymon CROME, DO at [facility] OR    BREAST SURGERY  October 2022    Lumpectomy    TONSILLECTOMY      at age 89     TUNNELED VENOUS PORT PLACEMENT  October 2022    [facility] Removed October 2024     Family History   Problem Relation Name Age of Onset    Arthritis-osteo Mother Meade  50    Hypertension Mother Meade         49    Stroke Mother Meade         50    Heart Disease Mother Meade         55    Thyroid Disease Father Dallas Mace         29    Cancer-Breast Paternal Aunt Elyn Hasten 55        [address] Maternal Grandmother Kensett         59    Cancer-Prostate Maternal Grandfather Gustav Pepper         58    Diabetes Paternal Grandmother Donia Mace         65    Arthritis-rheumatoid Father Dallas Mace         109     Social History[2]    Vaping/E-liquid Use    Vaping Use Never User                   Objective:          acetaminophen  (TYLENOL ) 325 mg tablet Take two tablets by mouth every 6 hours as needed for Pain. Indications: take two tabs every 6 hours for the first three days after surgery, then take as needed    alendronate (FOSAMAX) 70 mg tablet Take one tablet by mouth every 7 days. Take at least 30 minutes before breakfast with plain water . Do not lie down for 30 minutes.    anastrozole  (ARIMIDEX ) 1 mg tablet TAKE ONE TABLET BY MOUTH EVERY DAY    ARNUITY ELLIPTA 200 mcg/actuation disk inhaler     CHOLEcalciferoL (vitamin D3) 1,000 units tablet Take one tablet by mouth daily.    citalopram (CELEXA) 20 mg tablet Take one tablet by mouth daily.    LORazepam  (ATIVAN ) 0.5 mg tablet Take one tablet by mouth every 8 hours as needed for Nausea, Vomiting or Other... (insomnia). Max Daily Amount: 1.5 mg.    pantoprazole Kristy (PROTONIX) 40 mg tablet Take one tablet by mouth daily.    simvastatin (ZOCOR) 20 mg tablet Take one tablet by mouth at bedtime daily.     Vitals:    08/19/24 1012   BP: (!) 146/85   BP Source: Arm, Left Upper   Pulse: 73   Temp: 36.6 ?C (97.9 ?F)   Resp: 16   SpO2: 99%   TempSrc: Oral   PainSc: Zero   Weight: 74.4 kg (164 lb)     Body mass index is 30 kg/m?SABRA     Pain Score: Zero       Fatigue Scale: 0-None    Pain Addressed:  N/A    Patient Evaluated for a Clinical Trial: No treatment clinical trial available for this patient.     [place] Cooperative Oncology Group performance status is 0, Fully active, able to carry on all pre-disease performance without restriction.SABRA     Physical Exam  Vitals reviewed.         RIGHT BREAST EXAM:  Breast:  S/p lumpectomy/SLNB with well-healed scars. Faint radiation skin change. Trace-mild edema centrally and laterally at 9:00. No palpable lumps. No suspicious skin changes.   Skin Erythema:  No  Attachment of Overlying Skin:  No  Peau d' orange:  No  Chest Wall Attachment:  No  Nipple Inversion:  No  Nipple Discharge: No    LEFT BREAST EXAM:  Breast: No palpable masses. No skin changes.  Skin Erythema:  No  Attachment of Overlying Skin:  No  Peau d' orange:  No  Chest Wall Attachment: No  Nipple Inversion:  No  Nipple Discharge:  No    RIGHT NODAL BASIN EXAM:  Axillary:  negative  Infraclavicular:  negative  Supraclavicular:  negative    LEFT NODAL BASIN EXAM:  Axillary:  negative  Infraclavicular: negative  Supraclavicular:  negative    Constitutional: Alert, cooperative, NAD  Eyes: non-icteric, no discharge  ENT: NCAT, moist mucous membranes  Respiratory: non-labored respirations on RA, normal effort, no respiratory distress  Skin: warm, dry, no rashes or erythema, no cyanosis  Musculoskeletal: normal range of motion, no edema  Neuro: alert and orientated, moves all extremities, no cranial nerve deficit  Psych: behavior is normal, judgment and thought content normal  Lymphatic: no evidence of upper extremity lymphedema       IMAGING REVIEW:  EXAM:   MAMMO DIAGNOSTIC BIL/TOMO 08/19/24  9:25 AM     INDICATION:   33-month follow-up of calcifications/fat necrosis at lumpectomy site.    History of IDC in the right breast.     COMPARISON:   Compared to:   01/25/2018 MAMMO SCREEN BILAT/TOMO/CAD   07/22/2021 MAMMO SCREEN BILAT/TOMO   08/08/2021 MAMMO DIAGNOSTIC RT/TOMO   06/20/2022 MAMMO DIAGNOSTIC BIL/TOMO   12/22/2022 MAMMO DIAG RT   07/03/2023 MAMMO DIAGNOSTIC BIL/TOMO   01/03/2024 MAMMO DIAG RT     BREAST COMPOSITION:   Breast Density: Scattered areas of fibroglandular density      TECHNIQUE:   2-D and 3-D (digital tomosynthesis) mammographic images were obtained   bilaterally.     FINDINGS:   Expected evolution of fat necrosis at the lumpectomy site and stable   extent of scattered round calcifications also at the right lumpectomy   site, consistent with benign post therapeutic findings.     No suspicious finding the left breast.     IMPRESSION:     Benign right breast calcifications due to post therapeutic changes. No   evidence of breast malignancy bilaterally.     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 Lesh presents to clinic today for routine follow up. There is no clinical evidence of recurrence.  There have been no changes to her past medical or family history since her last visit other than starting on Fosamax as of today for osteopenia.  She had imaging today with bilateral diagnostic mammogram to follow-up on fat necrosis. Imaging today showed expected evolution of fat necrosis and stable calcifications, consistent with post-therapeutic changes. No suspicious findings. Results were reviewed during her visit today. She continues to follow with Kristy. Fernand for medical oncology and remains on anastrozole . She continues to follow with Kristy Martinez for radiation oncology. Ms. Israelson had bioimpedence spectroscopy (BIS) measurements today, which were normal. She denies any arm swelling or ROM issues. She had questions about the right upper extremity restrictions. We reviewed the recommended restrictions  due to history of SLNB, but she was reassured that she is low risk for lymphedema and that the right arm can be used for medically necessary reasons if needed. She has trace-mild lymphedema of the right breast. We reviewed MLD/massage and compression for treatment. Given that symptoms recently returned, she was encouraged to resume interventions the next 4-6 weeks and then can slowly back off of interventions as symptoms improved.  She was given a prescription for bras today. Ms. Dwane is three years out from diagnosis. She may return to the breast surgery clinic as needed. Given normal BIS, low risk of lymphedema, and no clinical concerns, routine BIS will be discontinued at this time. This can be performed in the future as needed if there are any clinical concerns. She will continue to follow with Kristy. Fernand for clinical exams and surveillance. She felt comfortable with this plan. She will be due for bilateral screening mammogram in 1 year, which has been ordered. We will then defer future screening imaging orders to her medical oncology team moving forward. Ms. Plunk and her husband were given ample time to ask questions, all of which were answered to their satisfaction. She was encouraged to call with any interval questions or concerns.     Continue follow up with Kristy. Fernand  Continue follow up with Kristy Martinez  Bilateral screening mammogram in September 2026  RTC PRN    Cheryal Salas E Traeh Milroy, PA-C                                  [1] No Known Allergies  [2]   Social History  Socioeconomic History    Marital status: Married   Tobacco Use    Smoking status: Never    Smokeless tobacco: Never   Vaping Use    Vaping status: Never Used   Substance and Sexual Activity    Alcohol use: Yes     Alcohol/week: 1.0 standard drink of alcohol     Types: 1 Glasses of wine per week     Comment: monthly    Drug use: Never    Sexual activity: Yes     Partners: Male     Birth control/protection: Post-menopausal

## 2024-08-19 ENCOUNTER — Encounter: Admit: 2024-08-19 | Discharge: 2024-08-19 | Payer: MEDICARE

## 2024-08-19 ENCOUNTER — Ambulatory Visit: Admit: 2024-08-19 | Discharge: 2024-08-19 | Payer: MEDICARE

## 2024-08-19 VITALS — BP 146/85 | HR 73 | Temp 97.90000°F | Resp 18 | Wt 164.2 lb

## 2024-08-19 VITALS — BP 146/85 | HR 73 | Temp 97.90000°F | Resp 16 | Wt 164.0 lb

## 2024-08-19 DIAGNOSIS — C50411 Malignant neoplasm of upper-outer quadrant of right female breast: Principal | ICD-10-CM

## 2024-08-19 DIAGNOSIS — Z9889 Other specified postprocedural states: Secondary | ICD-10-CM

## 2024-08-19 DIAGNOSIS — Z9189 Other specified personal risk factors, not elsewhere classified: Secondary | ICD-10-CM

## 2024-08-19 DIAGNOSIS — Z1231 Encounter for screening mammogram for malignant neoplasm of breast: Secondary | ICD-10-CM

## 2024-08-19 MED ORDER — ALENDRONATE 70 MG PO TAB
70 mg | ORAL_TABLET | ORAL | 3 refills | 84.00000 days | Status: AC
Start: 2024-08-19 — End: ?

## 2024-08-19 NOTE — Progress Notes
 Name: Kristy Martinez          MRN: [id number]      DOB: 1960/12/17      AGE: 63 y.o.   DATE OF SERVICE: 08/19/2024    Subjective:             Reason for Visit:  Follow Up      Kristy Martinez is a 63 y.o. female.      Cancer Staging   Malignant neoplasm of upper-outer quadrant of right breast in female, estrogen receptor negative (CMS-HCC)  Staging form: Breast, AJCC 8th Edition  - Clinical stage from 08/17/2021: Stage 0 (cTis (DCIS), cN0, cM0, ER+, PR+, HER2: Not Assessed) - Signed by Cristopher Kate HERO, PA-C on 08/17/2021  - Pathologic stage from 09/05/2021: Stage IA (pT1b, pN0, cM0, G2, ER-, PR-, HER2+) - Signed by Cristopher Kate HERO, PA-C on 09/21/2021    Past Oncology and Treatment History:  DIAGNOSIS: right breast cancer   PAST ONCOLOGY HISTORY: Ms Kristy Martinez is a 63 year old post menopausal female who underwent a routine screening mammogram which revealed a focal asymmetry with associated microcalcifications in the right breast.  Diagnostic mammogram showed a persistent asymmetry with associated fine pleomorphic calcifications in aggregate measuring approximately 2.3 cm.  There was no ultrasound correlate, therefore stereotactic right breast biopsy was performed on 08/11/21 ([place]). 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 On 08/29/2021 Dr Arcelia performed a right lumpectomy at [facility]. Pathology showed IDC, grade II, measuring 0.8cm, ER<1%, PR 0%, HER2 3+ IHC. On 09/19/2021 Dr Arcelia performed a right ALNB at [facility]. Pathology showed 0 LN involved (0/3).     She completed adjuvant Taxol  every week x12/Herceptin  every 3 weeks; completed 01/11/2022. Continue one year of Herceptin  completed 10/27/2022.   She completed radiation with Dr Jolaine 02/2022.    BREAST IMAGING:  Mammogram:    -- Bilateral screening mammogram 07/22/21 ([place]) revealed no suspicious abnormality is seen in the left breast. In the upper outer right breast, middle/posterior depth, there is a focal asymmetry with associated microcalcifications (MLO slice 30 and CC slice 38). Recommend right diagnostic mammogram and targeted ultrasound for further evaluation.   -- Right diagnostic mammogram 08/08/21 ([place]) revealed a persistent 1.5 cm focal asymmetry with subtle architectural distortion within the upper outer right breast middle-posterior depth at approximately 10:00. The focal asymmetry has associated fine pleomorphic calcifications in aggregate measuring approximately 2.0 x 1.5 x 2.3 cm in the AP, TV and CC dimensions.   Ultrasound:    -- Targeted right breast ultrasound 08/08/21 ([place]) revealed no convincing ultrasound correlate is identified for the mammographic focal asymmetry of concern. 5 morphologically normal appearing right axillary lymph nodes are documented. Impression: Suspicious focal asymmetry with associated calcifications in the upper outer right breast at 10:00. Stereotactic/tomosynthesis guided biopsy is recommended.     CXR 09/19/2021:    Left chest port with the tip overlying the mid SVC. Small nodular opacity overlying the right costophrenic angle. Repeat 2 view chest radiographs with nipple markers is recommended. If this persists on follow-up imaging, CT chest is recommended for better characterization.     OB/GYN HISTORY: Age at onset of menstruation 41. G21P1. She had her first child at age 67. LMP 45; uterus and ovaries intact. She never took HRT.     PRESENT THERAPY: Arimidex  1mg  daily started 10/2022.     Ms Kristy Martinez is here today for continued follow up.      Review of Systems   Genitourinary:  Post menopausal   Uterus and ovaries intact      No Known Allergies    Objective:          acetaminophen  (TYLENOL ) 325 mg tablet Take two tablets by mouth every 6 hours as needed for Pain. Indications: take two tabs every 6 hours for the first three days after surgery, then take as needed    alendronate (FOSAMAX) 70 mg tablet Take one tablet by mouth every 7 days. Take at least 30 minutes before breakfast with plain water . Do not lie down for 30 minutes.    anastrozole  (ARIMIDEX ) 1 mg tablet TAKE ONE TABLET BY MOUTH EVERY DAY    ARNUITY ELLIPTA 200 mcg/actuation disk inhaler     CHOLEcalciferoL (vitamin D3) 1,000 units tablet Take one tablet by mouth daily.    citalopram (CELEXA) 20 mg tablet Take one tablet by mouth daily.    LORazepam  (ATIVAN ) 0.5 mg tablet Take one tablet by mouth every 8 hours as needed for Nausea, Vomiting or Other... (insomnia). Max Daily Amount: 1.5 mg.    pantoprazole DR (PROTONIX) 40 mg tablet Take one tablet by mouth daily.    simvastatin (ZOCOR) 20 mg tablet Take one tablet by mouth at bedtime daily.     Vitals:    08/19/24 0809   BP: (!) 146/85   BP Source: Arm, Left Upper   Pulse: 73   Temp: 36.6 ?C (97.9 ?F)   Resp: 18   SpO2: 99%   TempSrc: Temporal   PainSc: Zero   Weight: 74.5 kg (164 lb 3.2 oz)     Body mass index is 30.03 kg/m?SABRA     Pain Score: Zero       Fatigue Scale: 0-None    Pain Addressed:  N/A    Patient Evaluated for a Clinical Trial: No treatment clinical trial available for this patient.     [place] Cooperative Oncology Group performance status is 0, Fully active, able to carry on all pre-disease performance without restriction.     Physical Exam  Vitals and nursing note reviewed.   Pulmonary:      Effort: No respiratory distress.   Chest:   Breasts:     Right: No inverted nipple, mass, nipple discharge, skin change or tenderness.      Left: No inverted nipple, mass, nipple discharge, skin change or tenderness.       Lymphadenopathy:      Cervical: No cervical adenopathy.      Upper Body:      Right upper body: No supraclavicular or axillary adenopathy.      Left upper body: No supraclavicular or axillary adenopathy.          RADIOLOGY:  CXR 09/19/2021: Left chest port with the tip overlying the mid SVC. Small nodular opacity overlying the right costophrenic angle. Repeat 2 view chest radiographs with nipple markers is recommended. If this persists on follow-up imaging, CT chest is recommended for better characterization.     CXR 10/12/2021:Dense nodular opacity at the right lung base just lateral to the nipple marker probably represents a calcified granuloma. However, given recent diagnosis of breast cancer, consider further evaluation with chest CT.     CT chest 11/02/2021:  1. Calcified granuloma within the right lower lobe, corresponding to the nodular opacity seen on recent chest radiograph.   2. Several scattered tiny nonspecific pulmonary nodules. Follow-up CT chest in 6 months is recommended for reevaluation.   3. 1.6 cm left thyroid nodule. Dedicated thyroid ultrasound is  recommended for further evaluation.       BMD 07/04/2022: Osteopenia (T-score -2.0)  BMD 01/26/2023: Osteopenia (T-score -2.0)    Bilateral diagnostic mammogram 06/20/2022: BIRAD 3-probably benign (right diagnostic mammogram in 6 months)   Right diagnostic mammogram 12/22/2022: BIRAD 3      Neck ultrasound 05/24/2022:  Isthmus: Normal in thickness. No discrete nodule.   Right lobe: Measures 3.5 x 1.3 x 1.4 cm.  The right lobe parenchyma is mildly heterogeneous. No discrete thyroid nodules are identified.     Left lobe: Measures 3.1 x 1.7 x 2.0 cm.  The left lobe background parenchyma is mildly heterogeneous. One nodule is measured.   *  Mid: Solid, isoechoic, smooth margins, wider than tall, and punctate echogenic foci. Measures 2.0 x 1.5 x 1.8 cm. TR 4   Normal size bilateral cervical lymph nodes are noted, which are most likely reactive in nature. No lymph nodes demonstrate calcification or cystic change.     Thyroid ultrasound 01/26/2023:  1.  Stable TR 4 nodule in the left thyroid lobe which meets ACR-TIRADS criteria for percutaneous biopsy.   2.  No suspicious cervical lymphadenopathy.      Bilateral diagnostic mammogram 07/03/2023: BIRAD 3-benign. Recommend right diagnostic mammogram in 6 months.   Right diagnostic mammogram 01/03/2024: BIRAD 3-Diagnostic mammogram in 6 months.     BMD 02/01/2024: Osteopenia (T-score -2.3)     Assessment and Plan:  1. 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 S/P right lumpectomy and SLNB with 0 LN involved. She was seen initially by Dr Babs.   2. We have reviewed the patient's radiology and pathology results in detail with her and her husband.  We have reviewed the particular features of her cancer including stage, grade, hormone receptor and HER2 neu status.   3. Due to her cancer being HER2 positive; we are recommending adjuvant chemotherapy with Taxol  weekly x12 and Herceptin  every 3 weeks for one year. Completed 12 cycles 01/11/2022. She continued with Herceptin  every 3 weeks for 1 year; 10/27/2022.    4. Repeat thyroid ultrasound due to CT chest 11/02/2021 showed 1.6 cm left thyroid nodule. Dedicated thyroid ultrasound is recommended for further evaluation.  Neck ultrasound recommended thyroid biopsy. She is not interested in biopsy at this time. Repeat thyroid ultrasound due 07/2023.    5. Bilateral mammogram today; results pending.   6. She completed radiation with Dr Jolaine 02/2022.     7. Her IDC was ER/PR negative. DCIS was ER+. We will recommend Arimidex  1mg  daily for 5 years; started 10/2022. She was again told the side effects including joint pain and bone loss.   8. Osteopenia; repeat BMD 01/2026. She will continue calcium nd Vitamin D. She was given a prescription for Fosamax 70mg  weekly today.   9. RTC in 6 months.     My collaborating MD Dr Verlean Bathe.   Total Time Today was 30 minutes in the following activities: Preparing to see the patient, Obtaining and/or reviewing separately obtained history, Performing a medically appropriate examination and/or evaluation, Counseling and educating the patient/family/caregiver, Ordering medications, tests, or procedures, and Documenting clinical information in the electronic or other health record    Corean CHRISTELLA Golas, APRN-NP

## 2024-08-19 NOTE — Progress Notes
 Lymphedema Prevention Bioimpedance Spectroscopy (BIS) Monitoring    Notified patient via MyChart result was normal.  Provided clinic contact information for any questions or concerns.     Reviewed BIS testing results from today.  Results:  RIGHT  Current: -3.6  Baseline: 6.6  Change from Baseline: -10.2    WNL less than 3 standard deviation increase from baseline.

## 2024-09-16 ENCOUNTER — Encounter: Admit: 2024-09-16 | Discharge: 2024-09-16 | Payer: MEDICARE

## 2024-09-16 DIAGNOSIS — C50411 Malignant neoplasm of upper-outer quadrant of right female breast: Principal | ICD-10-CM

## 2024-09-16 MED ORDER — ZOLEDRONIC ACID-MANNITOL-WATER 5 MG/100 ML IV PGBK
5 mg | Freq: Once | INTRAVENOUS | 0 refills
Start: 2024-09-16 — End: ?

## 2024-09-17 ENCOUNTER — Encounter: Admit: 2024-09-17 | Discharge: 2024-09-17 | Payer: MEDICARE

## 2024-09-19 ENCOUNTER — Encounter: Admit: 2024-09-19 | Discharge: 2024-09-19 | Payer: MEDICARE

## 2024-09-22 ENCOUNTER — Encounter: Admit: 2024-09-22 | Discharge: 2024-09-22 | Payer: MEDICARE

## 2024-09-26 ENCOUNTER — Encounter: Admit: 2024-09-26 | Discharge: 2024-09-26 | Payer: MEDICARE

## 2024-10-28 ENCOUNTER — Encounter: Admit: 2024-10-28 | Discharge: 2024-10-28 | Payer: MEDICARE

## 2024-10-28 MED ORDER — ANASTROZOLE 1 MG PO TAB
1 mg | ORAL_TABLET | Freq: Every day | ORAL | 3 refills | 33.00000 days | Status: AC
Start: 2024-10-28 — End: ?

## 2024-11-24 ENCOUNTER — Encounter: Admit: 2024-11-24 | Discharge: 2024-11-24 | Payer: MEDICARE

## 2024-11-28 ENCOUNTER — Encounter: Admit: 2024-11-28 | Discharge: 2024-11-28 | Payer: MEDICARE
